# Patient Record
Sex: Male | Born: 1950 | Race: White | Hispanic: No | Smoking: Former smoker
Health system: Southern US, Community
[De-identification: ages and names within clinical notes are randomized; demographics above are authoritative.]

## PROBLEM LIST (undated history)

## (undated) DIAGNOSIS — E7800 Pure hypercholesterolemia, unspecified: Secondary | ICD-10-CM

## (undated) DIAGNOSIS — J45909 Unspecified asthma, uncomplicated: Secondary | ICD-10-CM

## (undated) DIAGNOSIS — R569 Unspecified convulsions: Secondary | ICD-10-CM

## (undated) DIAGNOSIS — I10 Essential (primary) hypertension: Secondary | ICD-10-CM

## (undated) DIAGNOSIS — C679 Malignant neoplasm of bladder, unspecified: Secondary | ICD-10-CM

## (undated) DIAGNOSIS — E785 Hyperlipidemia, unspecified: Secondary | ICD-10-CM

## (undated) DIAGNOSIS — M1711 Unilateral primary osteoarthritis, right knee: Secondary | ICD-10-CM

## (undated) HISTORY — DX: Malignant neoplasm of bladder, unspecified: C67.9

## (undated) HISTORY — DX: Hyperlipidemia, unspecified: E78.5

## (undated) HISTORY — PX: FRACTURE SURGERY: SHX138

## (undated) HISTORY — PX: COLONOSCOPY: SHX174

## (undated) HISTORY — DX: Unspecified asthma, uncomplicated: J45.909

## (undated) HISTORY — PX: OTHER SURGICAL HISTORY: SHX169

## (undated) HISTORY — DX: Unspecified convulsions: R56.9

---

## 2005-02-08 HISTORY — PX: BLADDER SURGERY: SHX569

## 2005-09-28 ENCOUNTER — Emergency Department (HOSPITAL_COMMUNITY): Admission: EM | Admit: 2005-09-28 | Discharge: 2005-09-28 | Payer: Self-pay | Admitting: Emergency Medicine

## 2005-10-16 ENCOUNTER — Encounter (INDEPENDENT_AMBULATORY_CARE_PROVIDER_SITE_OTHER): Payer: Self-pay | Admitting: *Deleted

## 2005-10-16 ENCOUNTER — Ambulatory Visit (HOSPITAL_COMMUNITY): Admission: RE | Admit: 2005-10-16 | Discharge: 2005-10-17 | Payer: Self-pay | Admitting: Urology

## 2009-07-03 ENCOUNTER — Emergency Department (HOSPITAL_COMMUNITY): Admission: EM | Admit: 2009-07-03 | Discharge: 2009-07-04 | Payer: Self-pay | Admitting: Emergency Medicine

## 2009-07-06 ENCOUNTER — Emergency Department (HOSPITAL_COMMUNITY): Admission: EM | Admit: 2009-07-06 | Discharge: 2009-07-06 | Payer: Self-pay | Admitting: Emergency Medicine

## 2009-07-08 HISTORY — PX: ROTATOR CUFF REPAIR: SHX139

## 2009-07-25 ENCOUNTER — Emergency Department (HOSPITAL_COMMUNITY): Admission: EM | Admit: 2009-07-25 | Discharge: 2009-07-25 | Payer: Self-pay | Admitting: Emergency Medicine

## 2009-08-02 ENCOUNTER — Encounter (HOSPITAL_COMMUNITY): Admission: RE | Admit: 2009-08-02 | Discharge: 2009-09-29 | Payer: Self-pay | Admitting: Cardiology

## 2009-08-05 ENCOUNTER — Ambulatory Visit (HOSPITAL_BASED_OUTPATIENT_CLINIC_OR_DEPARTMENT_OTHER): Admission: RE | Admit: 2009-08-05 | Discharge: 2009-08-05 | Payer: Self-pay | Admitting: Orthopedic Surgery

## 2010-03-25 LAB — POCT I-STAT, CHEM 8
Chloride: 107 mEq/L (ref 96–112)
HCT: 45 % (ref 39.0–52.0)
HCT: 48 % (ref 39.0–52.0)
Hemoglobin: 15.3 g/dL (ref 13.0–17.0)
Hemoglobin: 16.3 g/dL (ref 13.0–17.0)
Potassium: 3.8 mEq/L (ref 3.5–5.1)
Sodium: 140 mEq/L (ref 135–145)
TCO2: 27 mmol/L (ref 0–100)

## 2010-03-25 LAB — LIPID PANEL
Total CHOL/HDL Ratio: 4.4 RATIO
VLDL: 16 mg/dL (ref 0–40)

## 2010-03-25 LAB — POCT CARDIAC MARKERS: Myoglobin, poc: 70.5 ng/mL (ref 12–200)

## 2010-03-26 LAB — URINALYSIS, ROUTINE W REFLEX MICROSCOPIC
Bilirubin Urine: NEGATIVE
Glucose, UA: NEGATIVE mg/dL
Ketones, ur: NEGATIVE mg/dL
Leukocytes, UA: NEGATIVE
Nitrite: NEGATIVE
Protein, ur: NEGATIVE mg/dL
Specific Gravity, Urine: 1.017 (ref 1.005–1.030)
Urobilinogen, UA: 0.2 mg/dL (ref 0.0–1.0)
pH: 5.5 (ref 5.0–8.0)

## 2010-03-26 LAB — CBC
HCT: 43.5 % (ref 39.0–52.0)
Hemoglobin: 14.7 g/dL (ref 13.0–17.0)
MCH: 31.6 pg (ref 26.0–34.0)
MCHC: 33.9 g/dL (ref 30.0–36.0)
MCV: 93.4 fL (ref 78.0–100.0)
Platelets: 236 K/uL (ref 150–400)
RBC: 4.66 MIL/uL (ref 4.22–5.81)
RDW: 13.3 % (ref 11.5–15.5)
WBC: 9.4 K/uL (ref 4.0–10.5)

## 2010-03-26 LAB — RAPID URINE DRUG SCREEN, HOSP PERFORMED
Cocaine: NOT DETECTED
Tetrahydrocannabinol: NOT DETECTED

## 2010-03-26 LAB — DIFFERENTIAL
Basophils Absolute: 0 K/uL (ref 0.0–0.1)
Basophils Relative: 1 % (ref 0–1)
Eosinophils Absolute: 0.1 K/uL (ref 0.0–0.7)
Eosinophils Relative: 2 % (ref 0–5)
Lymphocytes Relative: 14 % (ref 12–46)
Lymphs Abs: 1.3 K/uL (ref 0.7–4.0)
Monocytes Absolute: 0.8 K/uL (ref 0.1–1.0)
Monocytes Relative: 9 % (ref 3–12)
Neutro Abs: 7.1 K/uL (ref 1.7–7.7)
Neutrophils Relative %: 75 % (ref 43–77)

## 2010-03-26 LAB — COMPREHENSIVE METABOLIC PANEL WITH GFR
ALT: 24 U/L (ref 0–53)
AST: 28 U/L (ref 0–37)
Albumin: 3.4 g/dL — ABNORMAL LOW (ref 3.5–5.2)
Alkaline Phosphatase: 54 U/L (ref 39–117)
BUN: 17 mg/dL (ref 6–23)
CO2: 28 meq/L (ref 19–32)
Calcium: 9.6 mg/dL (ref 8.4–10.5)
Chloride: 105 meq/L (ref 96–112)
Creatinine, Ser: 1.09 mg/dL (ref 0.4–1.5)
GFR calc non Af Amer: >60 mL/min
Glucose, Bld: 112 mg/dL — ABNORMAL HIGH (ref 70–99)
Potassium: 4 meq/L (ref 3.5–5.1)
Sodium: 140 meq/L (ref 135–145)
Total Bilirubin: 0.5 mg/dL (ref 0.3–1.2)
Total Protein: 6.9 g/dL (ref 6.0–8.3)

## 2010-03-26 LAB — POCT I-STAT, CHEM 8
BUN: 18 mg/dL (ref 6–23)
Calcium, Ion: 1.21 mmol/L (ref 1.12–1.32)
Chloride: 104 meq/L (ref 96–112)
Creatinine, Ser: 1.1 mg/dL (ref 0.4–1.5)
Glucose, Bld: 107 mg/dL — ABNORMAL HIGH (ref 70–99)
HCT: 45 % (ref 39.0–52.0)
Hemoglobin: 15.3 g/dL (ref 13.0–17.0)
Potassium: 3.9 meq/L (ref 3.5–5.1)
Sodium: 139 meq/L (ref 135–145)
TCO2: 27 mmol/L (ref 0–100)

## 2010-03-26 LAB — PROTIME-INR
INR: 0.98 (ref 0.00–1.49)
Prothrombin Time: 12.9 s (ref 11.6–15.2)

## 2010-03-26 LAB — URINE CULTURE
Colony Count: NO GROWTH
Culture: NO GROWTH

## 2010-05-26 NOTE — Op Note (Signed)
NAME:  Todd Howell, Todd Howell NO.:  [id number]   MEDICAL RECORD NO.:  [id number]          PATIENT TYPE:  OIB   LOCATION:  1430                         FACILITY:  [facility]   PHYSICIAN:  Boston Service, M.D.DATE OF BIRTH:  26-Aug-1950   DATE OF PROCEDURE:  10/16/2005  DATE OF DISCHARGE:  10/17/2005                                 OPERATIVE REPORT   PREOPERATIVE DIAGNOSIS:  A 60 year old male seen in our office October 05, 2005 total gross painless hematuria.  Investigation included CT scan which  showed no hydronephrosis, renal mass or upper tract abnormalities,  suggestion of soft tissue density along the right wall of the bladder but no  evidence of extravesical disease.  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   POSTOPERATIVE DIAGNOSIS:  A 60 year old male seen in our office October 05, 2005 total gross painless hematuria.  Investigation included CT scan  which showed no hydronephrosis, renal mass or upper tract abnormalities,  suggestion of soft tissue density along the right wall of the bladder but no  evidence of extravesical disease.  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   PROCEDURE:  Cystoscopy, retrograde, transurethral resection of bladder  tumor, double-J stent placement, bladder biopsies and resection, superficial  specimens sent x 1, deep specimens sent x2, TUR VaporTrode.   SURGEON:  Boston Service, M.D.   ASSISTANT:  None.   ANESTHESIA:  General.   SPECIMENS:  Bladder tumor superficial x 1, deep x2.   DISPOSITION:  To pathology.   SPECIMENS:  To  pathology.   ESTIMATED BLOOD LOSS:  Minimal.   COMPLICATIONS:  None obvious.   DESCRIPTION OF PROCEDURE:  The patient was prepped and draped in the  dorsolithotomy position after institution of adequate level of general  anesthesia.  A well lubricated 21-French panendoscope was gently inserted at  the urethral meatus, normal urethra and sphincter, nonobstructive prostate.  Tumor easily visualized just beyond the junction of the prostate base and  bladder with careful inspection.  The right orifice was visible along the  medial edge of the tumor.  Left orifice was easily visualized.  Careful  inspection of the bladder with the 12 degrees, 30 degrees and 70 degrees  Storz lenses was accomplished.   Retrograde films were then performed, blocking catheter was inserted, first  at the left ureteral orifice then at the right ureteral orifice.  Films were  obtained which showed normal course and caliber of the ureter, pelvis and  calyces without evidence of filling defect or obstruction.  There was the  expected amount of extrinsic compression with crossing of the femoral  vessels but no filling defect or obstruction.  Prompt drainage at 3-5  minutes.  Endoscopic photographs were then taken.  Cystoscope was removed.  Resectoscope sheath was inserted.  Resection was begun along the right  lateral wall at about the 9 o'clock position, carried down to the 6 o'clock  position taking care to avoid bladder perforation or obturator reflex.  Once the bulk of papillary lesion had been resected down to the level of the  bladder wall, specimens were irrigated from the bladder and sent as  superficial tumor.  Cold cup biopsy forceps were then used to biopsy the  bed of the tumor in at least five or six locations.  Cold cup forceps were  then withdrawn and replaced with the TUR loop which was used to resect the  base of the tumor taking care to include underlying muscle.  Once deep  resection had been  accomplished, VaporTrode element was used to obtain  adequate hemostasis.  Prior to the initiation of resection at the time of  the retrograde films, the double-J stent had been placed on the right side  to help in localization of the right orifice and to avoid any injury to the  right distal ureter.  It was great help particularly in the medial aspect of  the resection.  Once all resectable tumor and underlying muscular tissue had  been resected, cold cup forceps were inserted.  Double-J stent was removed.  VaporTrode element was reinserted.  Small bleeding sites were lightly  fulgurated.  There was clear efflux from the right ureteral orifice.  Resectoscope sheath was withdrawn 22-French Foley was inserted.  Bladder was  then patiently irrigated with sterile water over period of about 20 minutes.  Foley was left to straight drain.  The patient was given a B&O suppository  and returned to recovery in satisfactory condition.           ______________________________  Boston Service, M.D.     RH/MEDQ  D:  10/16/2005  T:  10/17/2005  Job:  [id number]   cc:   Ladell Heads., MD  [facility]. [place]

## 2010-11-28 ENCOUNTER — Other Ambulatory Visit: Payer: Self-pay | Admitting: Cardiology

## 2011-07-02 ENCOUNTER — Encounter (INDEPENDENT_AMBULATORY_CARE_PROVIDER_SITE_OTHER): Payer: Self-pay | Admitting: Surgery

## 2011-07-02 ENCOUNTER — Ambulatory Visit (INDEPENDENT_AMBULATORY_CARE_PROVIDER_SITE_OTHER): Payer: BC Managed Care – PPO | Admitting: Surgery

## 2011-07-02 VITALS — BP 130/90 | HR 70 | Temp 99.2°F | Ht 69.0 in | Wt 184.0 lb

## 2011-07-02 DIAGNOSIS — K602 Anal fissure, unspecified: Secondary | ICD-10-CM

## 2011-07-02 DIAGNOSIS — K601 Chronic anal fissure: Secondary | ICD-10-CM | POA: Insufficient documentation

## 2011-07-02 MED ORDER — AMBULATORY NON FORMULARY MEDICATION: 1.0000 "application " | Freq: Four times a day (QID) | Status: DC

## 2011-07-02 NOTE — Progress Notes (Signed)
Subjective:     Patient ID: Todd Howell, male   DOB: 10/08/1950, 61 y.o.   MRN: [id number]  HPI  Todd Howell  Aug 28, 1950 [id number]  Patient Care Team: Quentin Mulling as PCP - General (Geriatric Medicine) Barrie Folk, MD as Consulting Physician (Gastroenterology)  This patient is a 61 y.o.male who presents today for surgical evaluation at the request of Dr. Madilyn Fireman.   Reason for visit: Persistent anal fissure.  Patient is a pleasant male who has noticed anal pain for a year.  He recalls 20 years ago.  He had what sounded like an abscess that had to be drained.  Then had to be reoperated.  No problem for two decades until this past year.  Had anal pain.  He was sent to gastroenterology.  Dr. Madilyn Fireman diagnosed an anal fissure.  He was started on diltiazem cream.  He went back on it.  It is not consistently closed.  Dr. Madilyn Fireman was concerned of persistent fissure and recommended to see surgery to consider sphincterotomy.  No history of Crohn's or ulcerative colitis. Both parents died from colon cancer, though.  He has a bowel movement 1-2 times a day.  Occasionally loose.  Is using stool softeners.  He is reluctant to have any surgery  Patient Active Problem List  Diagnosis  . Chronic anal fissure    Past Medical History  Diagnosis Date  . Asthma   . Cancer   . Hyperlipidemia   . Seizures     Past Surgical History  Procedure Date  . Rotator cuff repair 07/2009    right  . Bladder surgery 02/2005    History   Social History  . Marital Status: Married    Spouse Name: N/A    Number of Children: N/A  . Years of Education: N/A   Occupational History  . Not on file.   Social History Main Topics  . Smoking status: Former Smoker    Quit date: 07/02/1986  . Smokeless tobacco: Not on file  . Alcohol Use: Yes     6drinks  . Drug Use: No  . Sexually Active:    Other Topics Concern  . Not on file   Social History Narrative  . No narrative on file    Family History    Problem Relation Age of Onset  . Cancer Mother     colon  . Cancer Father     colon/lung    Current Outpatient Prescriptions  Medication Sig Dispense Refill  . CRESTOR 20 MG tablet       . divalproex (DEPAKOTE ER) 500 MG 24 hr tablet       . MICARDIS 40 MG tablet          No Known Allergies  BP 130/90  Pulse 70  Temp 99.2 F (37.3 C) (Temporal)  Ht 5\' 9"  (1.753 m)  Wt 184 lb (83.462 kg)  BMI 27.17 kg/m2  SpO2 97%  No results found.   Review of Systems  Constitutional: Negative for fever, chills and diaphoresis.  HENT: Negative for nosebleeds, sore throat, facial swelling, mouth sores, trouble swallowing and ear discharge.   Eyes: Negative for photophobia, discharge and visual disturbance.  Respiratory: Negative for choking, chest tightness, shortness of breath and stridor.   Cardiovascular: Negative for chest pain and palpitations.  Gastrointestinal: Positive for rectal pain. Negative for nausea, vomiting, abdominal pain, diarrhea, constipation, blood in stool, abdominal distention and anal bleeding.       No personal  nor family history of inflammatory bowel disease, irritable bowel syndrome, allergy such as Celiac Sprue, dietary/dairy problems, colitis, ulcers nor gastritis.   BOTH parents Dx'd with colon cancer in their 70's  No recent sick contacts/gastroenteritis.  No travel outside the country.  No changes in diet.    Genitourinary: Negative for dysuria, urgency, difficulty urinating and testicular pain.  Musculoskeletal: Negative for myalgias, back pain, arthralgias and gait problem.  Skin: Negative for color change, pallor, rash and wound.  Neurological: Negative for dizziness, speech difficulty, weakness, numbness and headaches.  Hematological: Negative for adenopathy. Does not bruise/bleed easily.  Psychiatric/Behavioral: Negative for hallucinations, confusion and agitation.       Objective:   Physical Exam  Constitutional: He is oriented to person,  place, and time. He appears well-developed and well-nourished. No distress.  HENT:  Head: Normocephalic.  Mouth/Throat: Oropharynx is clear and moist. No oropharyngeal exudate.  Eyes: Conjunctivae and EOM are normal. Pupils are equal, round, and reactive to light. No scleral icterus.  Neck: Normal range of motion. Neck supple. No tracheal deviation present.  Cardiovascular: Normal rate, regular rhythm and intact distal pulses.   Pulmonary/Chest: Effort normal and breath sounds normal. No respiratory distress.  Abdominal: Soft. He exhibits no distension. There is no tenderness. Hernia confirmed negative in the right inguinal area and confirmed negative in the left inguinal area.  Genitourinary:       Exam done with assistance of male Medical Assistant in the room.  Perianal skin clean with good hygiene.  No pruritis.  No external hemorrhoids of significance.  Smal R anterior skin tag.  No pilonidal disease.   No abscess/fistula.    Post midline fissure with heaped up sentinel anal skin tag.  Increased sphincter tone.      Musculoskeletal: Normal range of motion. He exhibits no tenderness.  Lymphadenopathy:    He has no cervical adenopathy.       Right: No inguinal adenopathy present.       Left: No inguinal adenopathy present.  Neurological: He is alert and oriented to person, place, and time. No cranial nerve deficit. He exhibits normal muscle tone. Coordination normal.  Skin: Skin is warm and dry. No rash noted. He is not diaphoretic. No erythema. No pallor.  Psychiatric: His behavior is normal. Judgment and thought content normal. His mood appears anxious.       Consolable, pleasant       Assessment:     Chronic anal fissure    Plan:     The anatomy & physiology of the anorectal region was discussed.  The pathophysiology of anal fissure and differential diagnosis was discussed.  Natural history progression  was discussed.   I stressed the importance of a bowel regimen to have  daily soft bowel movements to minimize progression of disease.     The patient's condition is not adequately controlled.  Non-operative treatment has not healed the fissure.  Therefore, I recommended examination under anesthesia for better examination to confirm the diagnosis and treat by lateral internal sphincterotomy to relax the spasm better & allow the fissure to heal.  Technique, benefits, alternatives were discussed.   I noted a good likelihood this will help address the problem.  Risks such as bleeding, pain, incontinence, recurrence, heart attack, death, and other risks were discussed.    Educational handouts further explaining the pathology, treatment options, and bowel regimen were given as well.  The patient expressed understanding & wishes to try the diltiazem cream will more time. This  time he promises is to use it 4 times a day.We reached a compromise where I would schedule surgery about 4 weeks out from now. If the fissure is healed by then, we will cancel surgery. If not, then we will proceed with sphincterotomy. He felt this was a reasonable plan.    I strongly recommend he consider a colonoscopy. He agrees.  He is afraid to get it done now.  I think it is reasonable to wait until the fissure has healed up first. He will think about things

## 2011-07-02 NOTE — Patient Instructions (Signed)
Anal Fissure, Adult An anal fissure is a small tear or crack in the skin around the anus. Bleeding from a fissure usually stops on its own within a few minutes. However, bleeding will often reoccur with each bowel movement until the crack heals.  CAUSES   Passing large, hard stools.   Frequent diarrheal stools.   Constipation.   Inflammatory bowel disease (Crohn's disease or ulcerative colitis).   Infections.   Anal sex.  SYMPTOMS   Small amounts of blood seen on your stools, on toilet paper, or in the toilet after a bowel movement.   Rectal bleeding.   Painful bowel movements.   Itching or irritation around the anus.  DIAGNOSIS Your caregiver will examine the anal area. An anal fissure can usually be seen with careful inspection. A rectal exam may be performed and a short tube (anoscope) may be used to examine the anal canal. TREATMENT   You may be instructed to take fiber supplements. These supplements can soften your stool to help make bowel movements easier.   Sitz baths may be recommended to help heal the tear. Do not use soap in the sitz baths.   A medicated cream or ointment may be prescribed to lessen discomfort.  HOME CARE INSTRUCTIONS   Maintain a diet high in fruits, whole grains, and vegetables. Avoid constipating foods like bananas and dairy products.   Take sitz baths as directed by your caregiver.   Drink enough fluids to keep your urine clear or pale yellow.   Only take over-the-counter or prescription medicines for pain, discomfort, or fever as directed by your caregiver. Do not take aspirin as this may increase bleeding.   Do not use ointments containing numbing medications (anesthetics) or hydrocortisone. They could slow healing.  SEEK MEDICAL CARE IF:   Your fissure is not completely healed within 3 days.   You have further bleeding.   You have a fever.   You have diarrhea mixed with blood.   You have pain.   Your problem is getting worse  rather than better.  MAKE SURE YOU:   Understand these instructions.   Will watch your condition.   Will get help right away if you are not doing well or get worse.  Document Released: 12/25/2004 Document Revised: 12/14/2010 Document Reviewed: 06/11/2010 [employer] Patient Information 2012 [employer]. 

## 2011-07-03 IMAGING — CT CT HEAD W/O CM
4 of 6 series · 17 of 47 positions shown, 19 images · non-contrast
Comparison: None.

CT HEAD

CLINICAL DATA: Head and face abrasions following a fall.  Possible
seizure.  No memory of the fall.

CT HEAD WITHOUT CONTRAST
CT CERVICAL SPINE WITHOUT CONTRAST
TECHNIQUE: Multidetector CT imaging of the head and cervical spine
was performed following the standard protocol without intravenous
contrast.  Multiplanar CT image reconstructions of the cervical
spine were also generated.

[Series 4: head trauma 2.4 h60s · axial · 0.43mm/px · z∈[+1332,+1450]mm · 5 of 72 slices shown, 7 images]
[im 12/72  brain]
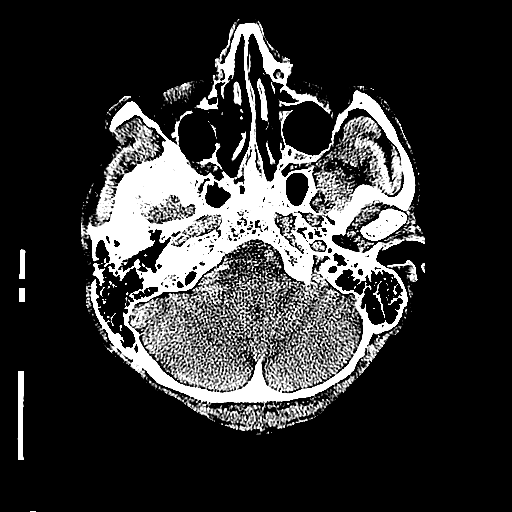
[im 12/72  bone]
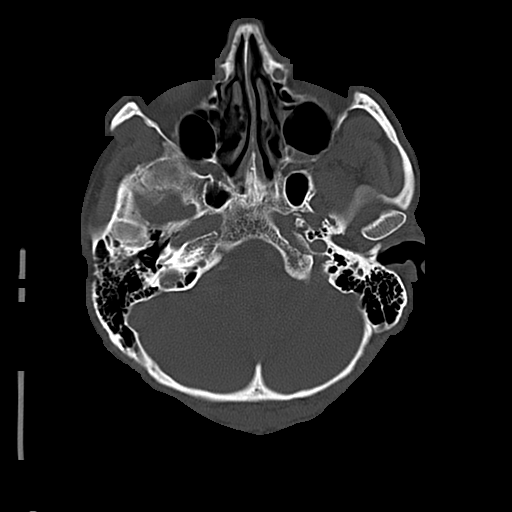
[im 24/72  brain]
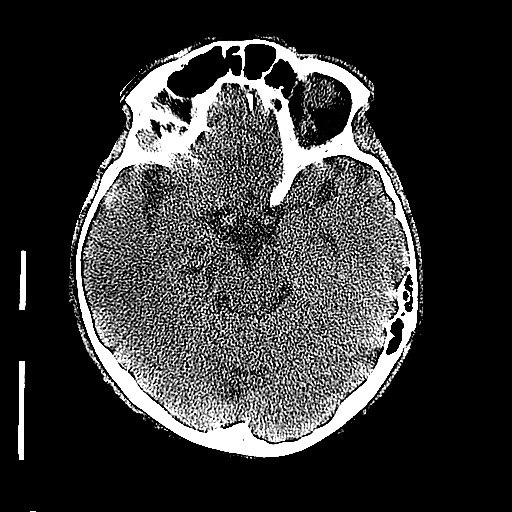
[im 36/72  brain]
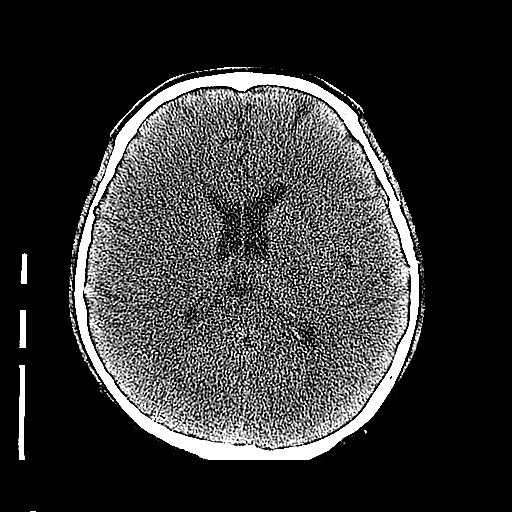
[im 48/72  brain]
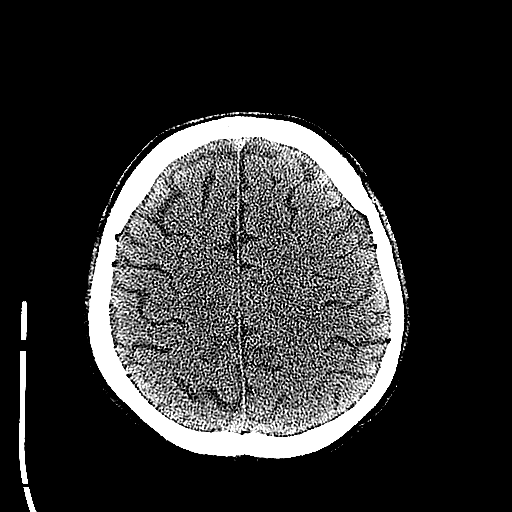
[im 60/72  brain]
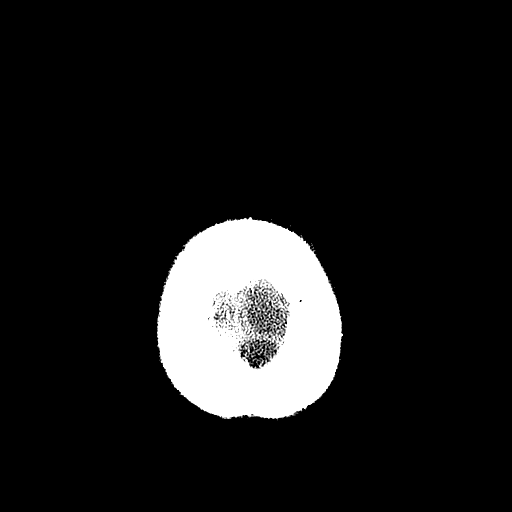
[im 60/72  bone]
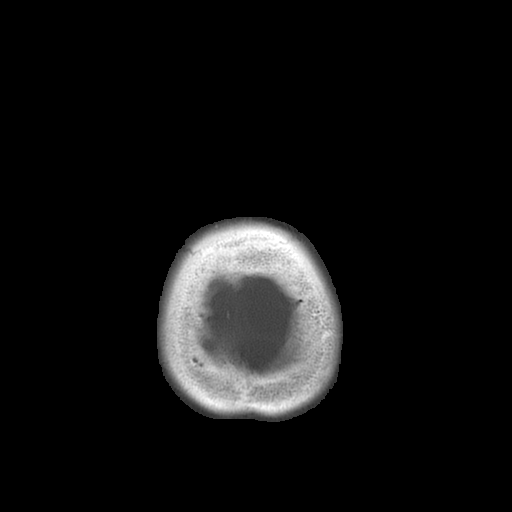

[Series 8: c_spine 2.0 spo thins · coronal · 0.43mm/px · 3 of 58 slices shown (1 of 3)]
[im 20/58  brain]
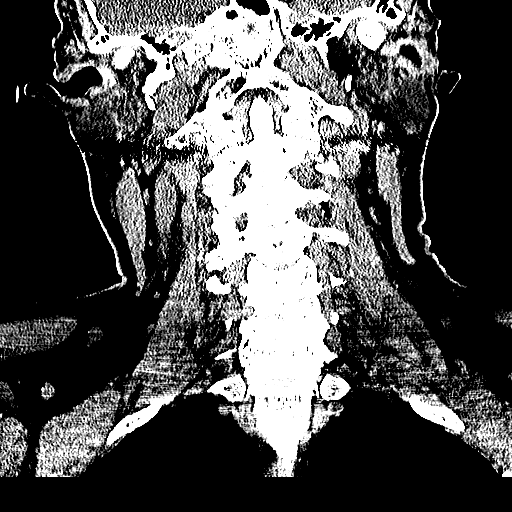
[im 26/58  brain]
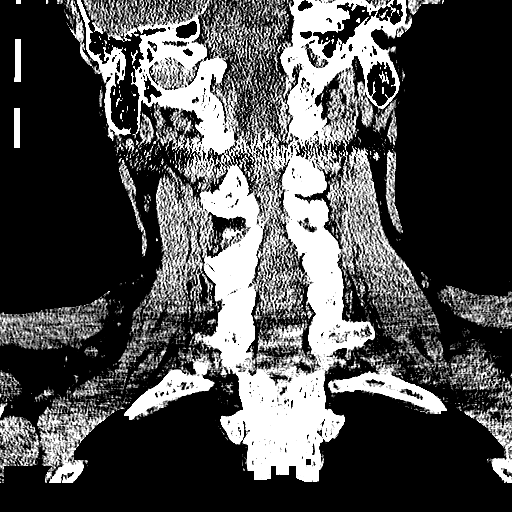
[im 32/58  brain]
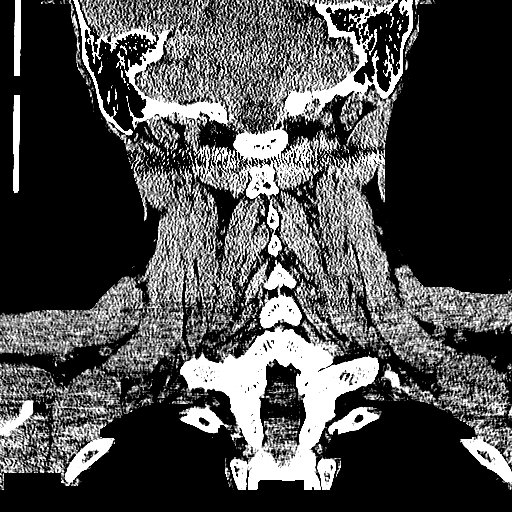

[Series 9: c_spine 2.0 spo thins · sagittal · 0.43mm/px · 3 of 63 slices shown (2 of 3)]
[im 21/63  brain]
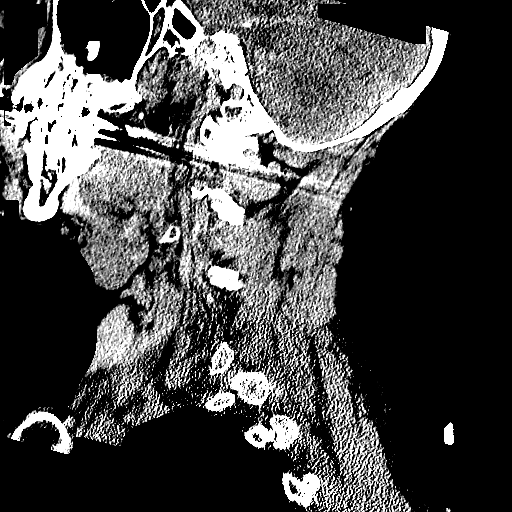
[im 32/63  brain]
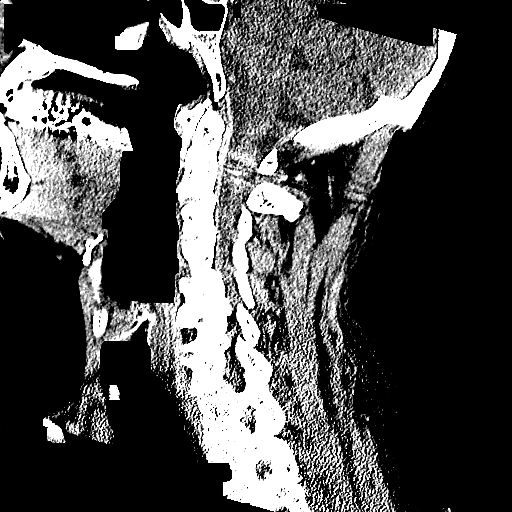
[im 42/63  brain]
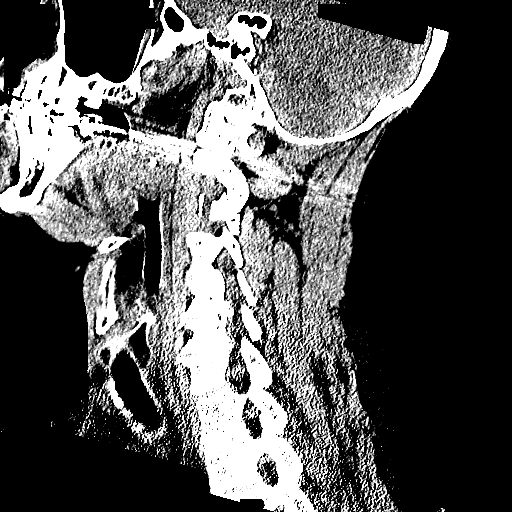

[Series 10: c_spine 2.0 spo thins · axial · 0.43mm/px · z∈[+1117,+1227]mm · 6 of 104 slices shown (3 of 3)]
[im 12/104  brain]
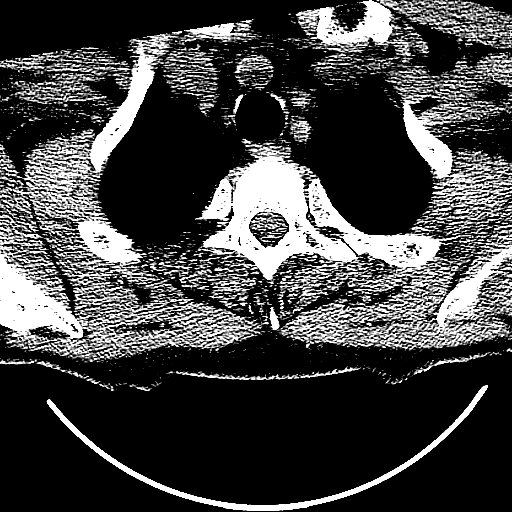
[im 23/104  brain]
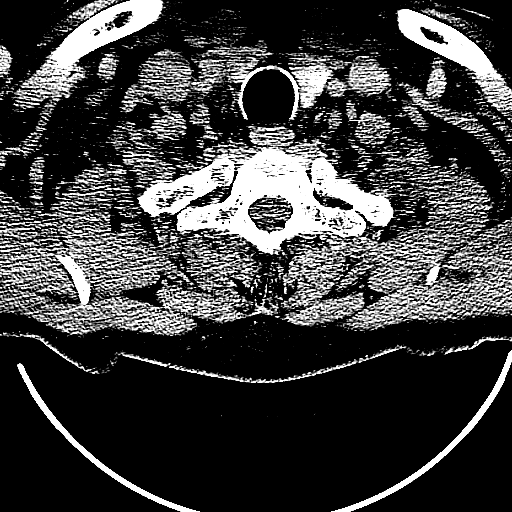
[im 35/104  brain]
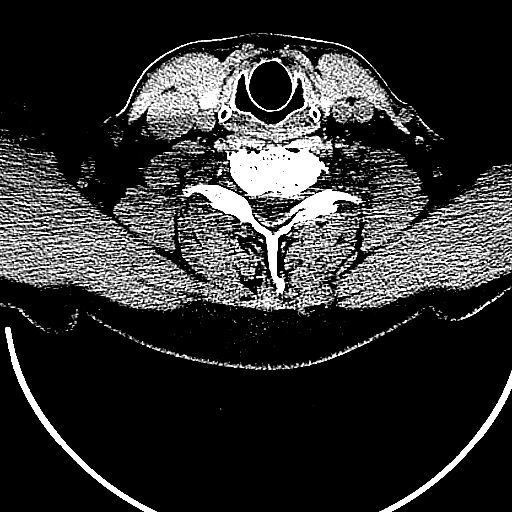
[im 46/104  brain]
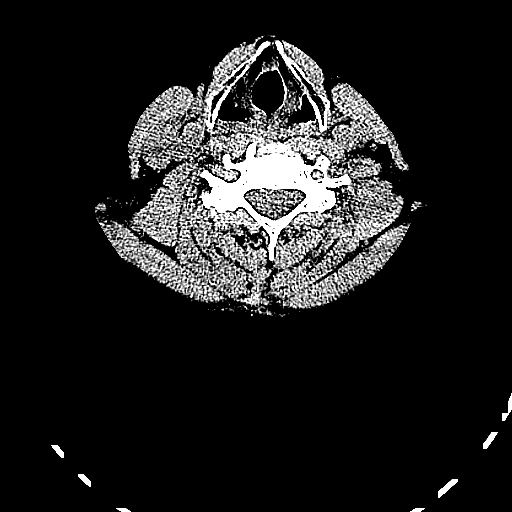
[im 58/104  brain]
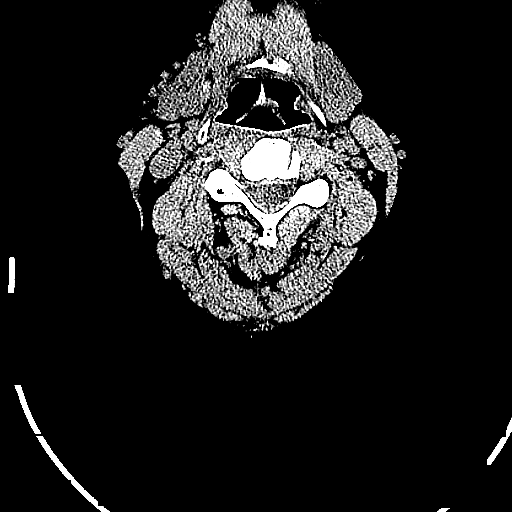
[im 69/104  brain]
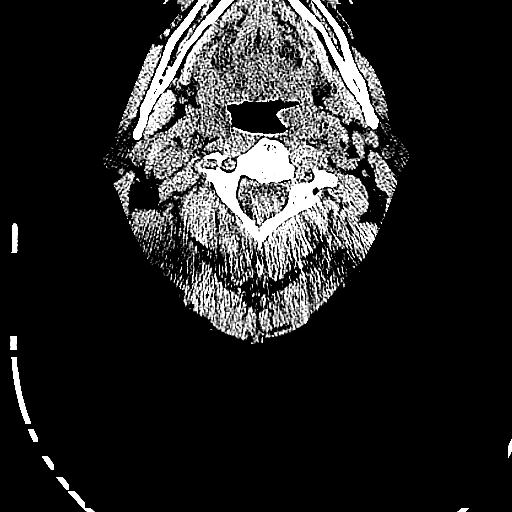

[17 of 47 positions shown; findings below may reference images not displayed]

FINDINGS: Normal appearing cerebral hemispheres and posterior fossa
structures.  Normal size and position of the ventricles.  No skull
fracture, intracranial hemorrhage or paranasal sinus air-fluid
levels.  No mass lesion or CT evidence of acute infarction.  Left
maxillary sinus mucosal thickening with a maximum thickness of
mm.
IMPRESSION: Mild chronic left maxillary sinusitis.  Otherwise, normal
examination.

CT CERVICAL SPINE
FINDINGS: Multilevel degenerative changes.  No prevertebral soft
tissue swelling, fractures or subluxations.  Minimal right carotid
bifurcation calcification.
IMPRESSION: 1.  No fracture or subluxation.
2.  Multilevel degenerative changes.
3.  Minimal right carotid bifurcation atheromatous calcification.

## 2011-09-20 DIAGNOSIS — K648 Other hemorrhoids: Secondary | ICD-10-CM

## 2011-09-20 DIAGNOSIS — K602 Anal fissure, unspecified: Secondary | ICD-10-CM

## 2011-09-20 HISTORY — PX: ANAL FISSURE REPAIR: SHX2312

## 2011-09-20 HISTORY — PX: HEMORRHOID SURGERY: SHX153

## 2011-09-25 ENCOUNTER — Telehealth (INDEPENDENT_AMBULATORY_CARE_PROVIDER_SITE_OTHER): Payer: Self-pay | Admitting: General Surgery

## 2011-09-25 NOTE — Telephone Encounter (Signed)
Pt called to give update following repair anal fissure on 09/20/11.  He has had a bowel movement and is managing pain okay.  He reports feeling like "something is coming out" of the sphincter.  It's "like a grape" between his buttocks.  Recommended warm sitz baths, okay to use cream or gel on hand, stool softeners, baby wipes, etc.  His post op appt is next Tues, 10/02/11, and will check it then.  Pt understands.

## 2011-09-28 ENCOUNTER — Telehealth (INDEPENDENT_AMBULATORY_CARE_PROVIDER_SITE_OTHER): Payer: Self-pay

## 2011-09-28 DIAGNOSIS — Z9889 Other specified postprocedural states: Secondary | ICD-10-CM

## 2011-09-28 MED ORDER — HYDROCODONE-ACETAMINOPHEN 5-325 MG PO TABS: 1.0000 | ORAL_TABLET | Freq: Four times a day (QID) | ORAL | Status: DC | PRN

## 2011-09-28 NOTE — Telephone Encounter (Signed)
Pt requesting refill on Oxycodone. I adv. Pt that I could call him in the automatic refill for hydrocodone 5/325mg . The pt understands. I will call the script to [facility] [address]

## 2011-10-02 ENCOUNTER — Ambulatory Visit (INDEPENDENT_AMBULATORY_CARE_PROVIDER_SITE_OTHER): Payer: BC Managed Care – PPO | Admitting: Surgery

## 2011-10-02 VITALS — BP 118/72 | HR 66 | Temp 97.9°F | Resp 16 | Ht 69.0 in | Wt 182.4 lb

## 2011-10-02 DIAGNOSIS — K602 Anal fissure, unspecified: Secondary | ICD-10-CM

## 2011-10-02 DIAGNOSIS — K648 Other hemorrhoids: Secondary | ICD-10-CM

## 2011-10-02 DIAGNOSIS — K601 Chronic anal fissure: Secondary | ICD-10-CM

## 2011-10-02 MED ORDER — OXYCODONE HCL 5 MG PO TABS: 5.0000 mg | ORAL_TABLET | Freq: Four times a day (QID) | ORAL | Status: DC | PRN

## 2011-10-02 NOTE — Patient Instructions (Addendum)
Managing Pain  Pain after surgery or related to activity is often due to strain/injury to muscle, tendon, nerves and/or incisions.  This pain is usually short-term and will improve in a few months.   Many people find it helpful to do the following things TOGETHER to help speed the process of healing and to get back to regular activity more quickly:  1. Avoid heavy physical activity a.  no lifting greater than 20 pounds b. Do not "push through" the pain.  Listen to your body and avoid positions and maneuvers than reproduce the pain c. Walking is okay as tolerated, but go slowly and stop when getting sore.  d. Remember: If it hurts to do it, then don't do it! 2. Take Anti-inflammatory medication  a. Take with food/snack around the clock for 1-2 weeks i. This helps the muscle and nerve tissues become less irritable and calm down faster b. Choose ONE of the following over-the-counter medications: i. Naproxen 220mg  tabs (ex. Aleve) 1-2 pills twice a day  ii. Ibuprofen 200mg  tabs (ex. Advil, Motrin) 3-4 pills with every meal and just before bedtime iii. Acetaminophen 500mg  tabs (Tylenol) 1-2 pills with every meal and just before bedtime 3. Use a Heating pad or Ice/Cold Pack a. 4-6 times a day b. May use warm bath/hottub  or showers 4. Try Gentle Massage and/or Stretching  a. at the area of pain many times a day b. stop if you feel pain - do not overdo it  Try these steps together to help you body heal faster and avoid making things get worse.  Doing just one of these things may not be enough.    If you are not getting better after two weeks or are noticing you are getting worse, contact our office for further advice; we may need to re-evaluate you & see what other things we can do to help.  HEMORRHOIDS   The rectum is the last few inches of your colon, and it naturally stretches to hold stool.  Hemorrhoidal piles are natural clusters of blood vessels that help the rectum stretch to hold  stool and allow bowel movements to eliminate feces.  Hemorrhoids are abnormally swollen blood vessels in the rectum.  Too much pressure in the rectum causes hemorrhoids by forcing blood to stretch and bulge the walls of the veins, sometimes even rupturing them.  Hemorrhoids can become like varicose veins you might see on a person's legs. When bulging hemorrhoidal veins are irritated, they can swell, burn, itch, become very painful, and bleed. Once the rectal veins have been stretched out and hemorrhoids created, they are difficult to get rid of completely and tend to recur with less straining than it took to cause them in the first place. Fortunately, good habits and simple medical treatment usually control hemorrhoids well, and surgery is only recommended in unusually severe cases. Some of the most frequent causes of hemorrhoids:    Constant sitting    Straining with bowel movements (from constipation or hard stools)    Diarrhea    Sitting on the toilet for a long time    Severe coughing    Childbirth    Heavy Lifting  Types of Hemorrhoids:    Internal hemorrhoids usually don't hurt or itch; they are deep inside the rectum and usually have no sensation. However, internal hemorrhoids can bleed.  Such bleeding should not be ignored and mask blood from a dangerous source like colorectal cancer, so persistent rectal bleeding should be investigated with a  colonoscopy.    External hemorrhoids cause most of the symptoms - pain, burning, and itching. Unirritated hemorrhoids can look like small skin tags coming out of the anus.     Thrombosed hemorrhoids can form when a hemorrhoid blood vessel bursts and causes the hemorrhoid to swell.  A purple blood clot can form in it and become an excruciatingly painful lump at the anus. Because of these unpleasant symptoms, immediate incision and drainage by a surgeon at an office visit can provide much relief of the pain.    PREVENTION Avoiding the causes listed in  above will prevent most cases of hemorrhoids, but this advice is sometimes hard to follow:  How can you avoid sitting all day if you have a seated job? Also, we try to avoid coughing and diarrhea, but sometimes it's beyond your control.  Still, there are some practical hints to help:    If your main job activity is seated, always stand or walk during your breaks. Make it a point to stand and walk at least 5 minutes every hour and try to shift frequently in your chair to avoid direct rectal pressure.    Always exhale as you strain or lift. Don't hold your breath.    Treat coughing, diarrhea and constipation early since irritated hemorrhoids may soon follow.    Do not delay or try to prevent a bowel movement when the urge is present.   Exercise regularly (walking or jogging 60 minutes a day) to stimulate the bowels to move.   Avoid dry toilet paper when cleaning after bowel movements.  Moistened tissues such as baby wipes are less irritating.  Lightly pat the rectal area dry.  Using irrigating showers or bottle irrigation washing can more gently clean this sensitive area.   Keep the anal and genital area clean and  dry.  Talcum or baby powders can help   GET YOUR STOOLS SOFT.   This is the most important way to prevent irritated hemorrhoids.  Hard stools are like sandpaper to the anorectal canal and will cause more problems.   The goal: ONE SOFT BOWEL MOVEMENT A DAY!  To have soft, regular bowel movements:    Drink at least 8 tall glasses of water a day.     AVOID CONSTIPATION    Take plenty of fiber.  Fiber is the undigested part of plant food that passes into the colon, acting s "natures broom" to encourage bowel motility and movement.  Fiber can absorb and hold large amounts of water. This results in a larger, bulkier stool, which is soft and easier to pass. Work gradually over several weeks up to 6 servings a day of fiber (25g a day even more if needed) in the form of: o Vegetables -- Root (potatoes,  carrots, turnips), leafy green (lettuce, salad greens, celery, spinach), or cooked high residue (cabbage, broccoli, etc) o Fruit -- Fresh (unpeeled skin & pulp), Dried (prunes, apricots, cherries, etc ),  or stewed ( applesauce)  o Whole grain breads, pasta, etc (whole wheat)  o Bran cereals    Bulking Agents -- This type of water-retaining fiber generally is easily obtained each day by one of the following:  o Psyllium bran -- The psyllium plant is remarkable because its ground seeds can retain so much water. This product is available as Metamucil, Konsyl, Effersyllium, Per Diem Fiber, or the less expensive generic preparation in drug and health food stores. Although labeled a laxative, it really is not a laxative.  o  Methylcellulose -- This is another fiber derived from wood which also retains water. It is available as Citrucel. o Polyethylene Glycol - and "artificial" fiber commonly called Miralax or Glycolax.  It is helpful for people with gassy or bloated feelings with regular fiber o Flax Seed - a less gassy fiber than psyllium   No reading or other relaxing activity while on the toilet. If bowel movements take longer than 5 minutes, you are too constipated   Laxatives can be useful for a short period if constipation is severe o Osmotics (Milk of Magnesia, Fleets phosphosoda, Magnesium citrate, MiraLax, GoLytely) are safer than  o Stimulants (Senokot, Castor Oil, Dulcolax, Ex Lax)    o Do not take laxatives for more than 7days in a row.   Laxatives are not a good long-term solution as it can stress the intestine and colon and causes too much mineral and fluid losses.    If badly constipated, try a Bowel Retraining Program: o Do not use laxatives.  o Eat a diet high in roughage, such as bran cereals and leafy vegetables.  o Drink six (6) ounces of prune or apricot juice each morning.  o Eat two (2) large servings of stewed fruit each day.  o Take one (1) heaping dose of a bulking agent (ex.  Metamucil, Citrucel, Miralax) twice a day.  o Use sugar-free sweetener when possible to avoid excessive calories.  o Eat a normal breakfast.  o Set aside 15 minutes after breakfast to sit on the toilet, but do not strain to have a bowel movement.  o If you do not have a bowel movement by the third day, use an enema and repeat the above steps.    AVOID DIARRHEA o Switch to liquids and simpler foods for a few days to avoid stressing your intestines further. o Avoid dairy products (especially milk & ice cream) for a short time.  The intestines often can lose the ability to digest lactose when stressed. o Avoid foods that cause gassiness or bloating.  Typical foods include beans and other legumes, cabbage, broccoli, and dairy foods.  Every person has some sensitivity to other foods, so listen to our body and avoid those foods that trigger problems for you. o Adding fiber (Citrucel, Metamucil, psyllium, Miralax) gradually can help thicken stools by absorbing excess fluid and retrain the intestines to act more normally.  Slowly increase the dose over a few weeks.  Too much fiber too soon can backfire and cause cramping & bloating. o Probiotics (such as active yogurt, Align, etc) may help repopulate the intestines and colon with normal bacteria and calm down a sensitive digestive tract.  Most studies show it to be of mild help, though, and such products can be costly. o Medicines:   Bismuth subsalicylate (ex. Kayopectate, Pepto Bismol) every 30 minutes for up to 6 doses can help control diarrhea.  Avoid if pregnant.   Loperamide (Immodium) can slow down diarrhea.  Start with two tablets (4mg  total) first and then try one tablet every 6 hours.  Avoid if you are having fevers or severe pain.  If you are not better or start feeling worse, stop all medicines and call your doctor for advice o Call your doctor if you are getting worse or not better.  Sometimes further testing (cultures, endoscopy, X-ray studies,  bloodwork, etc) may be needed to help diagnose and treat the cause of the diarrhea.   If these preventive measures fail, you must take action right away! Hemorrhoids  are one condition that can be mild in the morning and become intolerable by nightfall.

## 2011-10-02 NOTE — Progress Notes (Signed)
Subjective:     Patient ID: Todd Howell, male   DOB: 01-Jan-1951, 61 y.o.   MRN: [id number]  HPI  Todd Howell  [birth year] [id number]  Patient Care Team: Quentin Mulling as PCP - General (Geriatric Medicine) Barrie Folk, MD as Consulting Physician (Gastroenterology)  This patient is a 61 y.o.male who presents today for surgical evaluation.   It is now two weeks from EUA, sphincterotomy, internal hemorrhoidectomy x2.  Ran out of narcotics.  Trying to use ibuprofen twice a day.  Still rather sore.  Bowels moving better.  Bleeding less.  No fevers or chills.  Feels like the worst to spasm but still rather sore.  He does feel a lump near the anus.  Patient Active Problem List  Diagnosis  . Chronic anal fissure  . Internal hemorrhoids with bleeding    Past Medical History  Diagnosis Date  . Asthma   . Cancer   . Hyperlipidemia   . Seizures     Past Surgical History  Procedure Date  . Rotator cuff repair 07/2009    right  . Bladder surgery 02/2005    History   Social History  . Marital Status: Married    Spouse Name: N/A    Number of Children: N/A  . Years of Education: N/A   Occupational History  . Not on file.   Social History Main Topics  . Smoking status: Former Smoker    Quit date: 07/02/1986  . Smokeless tobacco: Not on file  . Alcohol Use: Yes     6drinks  . Drug Use: No  . Sexually Active:    Other Topics Concern  . Not on file   Social History Narrative  . No narrative on file    Family History  Problem Relation Age of Onset  . Cancer Mother     colon  . Cancer Father     colon/lung    Current Outpatient Prescriptions  Medication Sig Dispense Refill  . CRESTOR 20 MG tablet       . divalproex (DEPAKOTE ER) 500 MG 24 hr tablet       . HYDROcodone-acetaminophen (NORCO) 5-325 MG per tablet Take 1-2 tablets by mouth every 6 (six) hours as needed for pain.  30 tablet  0  . MICARDIS 40 MG tablet       . AMBULATORY NON FORMULARY MEDICATION  Place 1 application rectally 4 (four) times daily. Diltiazem 2% compounded suspension.  1 Tube  2  . oxyCODONE (OXY IR/ROXICODONE) 5 MG immediate release tablet Take 1-2 tablets (5-10 mg total) by mouth every 6 (six) hours as needed for pain.  40 tablet  0     No Known Allergies  BP 118/72  Pulse 66  Temp 97.9 F (36.6 C) (Temporal)  Resp 16  Ht 5\' 9"  (1.753 m)  Wt 182 lb 6.4 oz (82.736 kg)  BMI 26.94 kg/m2  No results found.   Review of Systems  Constitutional: Negative for fever, chills and diaphoresis.  HENT: Negative for sore throat, trouble swallowing and neck pain.   Eyes: Negative for photophobia and visual disturbance.  Respiratory: Negative for choking and shortness of breath.   Cardiovascular: Negative for chest pain and palpitations.  Gastrointestinal: Positive for anal bleeding and rectal pain. Negative for nausea, vomiting, constipation, blood in stool and abdominal distention.  Genitourinary: Negative for dysuria, urgency, difficulty urinating and testicular pain.  Musculoskeletal: Negative for myalgias, arthralgias and gait problem.  Skin: Negative for color change  and rash.  Neurological: Negative for dizziness, speech difficulty, weakness and numbness.  Hematological: Negative for adenopathy.  Psychiatric/Behavioral: Negative for hallucinations, confusion and agitation.       Objective:   Physical Exam  Constitutional: He is oriented to person, place, and time. He appears well-developed and well-nourished. No distress.  HENT:  Head: Normocephalic.  Mouth/Throat: Oropharynx is clear and moist. No oropharyngeal exudate.  Eyes: Conjunctivae normal and EOM are normal. Pupils are equal, round, and reactive to light. No scleral icterus.  Neck: Normal range of motion. No tracheal deviation present.  Cardiovascular: Normal rate, normal heart sounds and intact distal pulses.   Pulmonary/Chest: Effort normal. No respiratory distress.  Abdominal: Soft. He exhibits no  distension. There is no tenderness. Hernia confirmed negative in the right inguinal area and confirmed negative in the left inguinal area.       Incisions clean with normal healing ridges.  No hernias  Genitourinary:       Perianal skin clean with good hygiene.  No pruritis.   No pilonidal disease.  No fissure.  No abscess/fistula. Normal sphincter tone.    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  No bleeding    Musculoskeletal: Normal range of motion. He exhibits no tenderness.  Neurological: He is alert and oriented to person, place, and time. No cranial nerve deficit. He exhibits normal muscle tone. Coordination normal.  Skin: Skin is warm and dry. No rash noted. He is not diaphoretic.  Psychiatric: He has a normal mood and affect. His behavior is normal.       Assessment:     Sore but stabilizing    Plan:     Increase activity as tolerated.  Do not push through pain.  Make ibuprofen 4 times a day.  Restart narcotics.  I am hopeful that the swelling around the wound will flatten down and calm down.  Followup.  Avoid reoperating.  Advanced on diet as tolerated. Bowel regimen to avoid problems.  Switch off Metamucil Is still struggling with diarrhea.  Return to clinic 2-3 weeks. The patient expressed understanding and appreciation

## 2011-10-19 ENCOUNTER — Telehealth (INDEPENDENT_AMBULATORY_CARE_PROVIDER_SITE_OTHER): Payer: Self-pay

## 2011-10-19 NOTE — Telephone Encounter (Signed)
I paged Dr Michaell Cowing and got the ok to give him Oxycodone 5mg  #40.  I will have another md write it.  He also wants to make sure the patient is doing warm soaks, using wet wipes and taking one of the NSAIDS throughout the day.  I called the pt and let him know we will have the rx ready for him to pick up.  I asked him about the soaks and he is not doing them as much as he should.  He has used some Ibuprofen and is using the wet wipes.    The rx is for Oxycodone 5 mg po 1-2 q 6hrs prn pain #40 with no refills

## 2011-10-19 NOTE — Telephone Encounter (Signed)
The patient called requesting more Oxycodone 5mg .  He is very uncomfortable and especially after bowel movements.  He is having some bleeding and drainage.  He has some tissue that is popped out.  He has no fever.  He comes in next Wednesday and wants something to hold him over until then.

## 2011-10-24 ENCOUNTER — Ambulatory Visit (INDEPENDENT_AMBULATORY_CARE_PROVIDER_SITE_OTHER): Payer: BC Managed Care – PPO | Admitting: Surgery

## 2011-10-24 ENCOUNTER — Encounter (INDEPENDENT_AMBULATORY_CARE_PROVIDER_SITE_OTHER): Payer: Self-pay | Admitting: Surgery

## 2011-10-24 VITALS — BP 116/70 | HR 72 | Temp 97.8°F | Resp 16 | Ht 69.0 in | Wt 187.4 lb

## 2011-10-24 DIAGNOSIS — K602 Anal fissure, unspecified: Secondary | ICD-10-CM

## 2011-10-24 DIAGNOSIS — K648 Other hemorrhoids: Secondary | ICD-10-CM

## 2011-10-24 DIAGNOSIS — K601 Chronic anal fissure: Secondary | ICD-10-CM

## 2011-10-24 NOTE — Patient Instructions (Signed)
ANORECTAL SURGERY: POST OP INSTRUCTIONS  1. Take your usually prescribed home medications unless otherwise directed. 2. DIET: Follow a light bland diet the first 24 hours after arrival home, such as soup, liquids, crackers, etc.  Be sure to include lots of fluids daily.  Avoid fast food or heavy meals as your are more likely to get nauseated.  Eat a low fat the next few days after surgery.   3. PAIN CONTROL: a. Pain is best controlled by a usual combination of three different methods TOGETHER: i. Ice/Heat ii. Over the counter pain medication iii. Prescription pain medication b. Most patients will experience some swelling and discomfort in the anus/rectal area. and incisions.  Ice packs or heat (30-60 minutes up to 6 times a day) will help. Use ice for the first few days to help decrease swelling and bruising, then switch to heat such as warm towels, sitz baths, warm baths, etc to help relax tight/sore spots and speed recovery.  Some people prefer to use ice alone, heat alone, alternating between ice & heat.  Experiment to what works for you.  Swelling and bruising can take several weeks to resolve.   c. It is helpful to take an over-the-counter pain medication regularly for the first few weeks.  Choose one of the following that works best for you: i. Naproxen (Aleve, etc)  Two 220mg tabs twice a day ii. Ibuprofen (Advil, etc) Three 200mg tabs four times a day (every meal & bedtime) iii. Acetaminophen (Tylenol, etc) 500-650mg four times a day (every meal & bedtime) d. A  prescription for pain medication (such as oxycodone, hydrocodone, etc) should be given to you upon discharge.  Take your pain medication as prescribed.  i. If you are having problems/concerns with the prescription medicine (does not control pain, nausea, vomiting, rash, itching, etc), please call us [phone] to see if we need to switch you to a different pain medicine that will work better for you and/or control your side effect  better. ii. If you need a refill on your pain medication, please contact your pharmacy.  They will contact our office to request authorization. Prescriptions will not be filled after 5 pm or on week-ends. 4. KEEP YOUR BOWELS REGULAR a. The goal is one bowel movement a day b. Avoid getting constipated.  Between the surgery and the pain medications, it is common to experience some constipation.  Increasing fluid intake and taking a fiber supplement (such as Metamucil, Citrucel, FiberCon, MiraLax, etc) 1-2 times a day regularly will usually help prevent this problem from occurring.  A mild laxative (prune juice, Milk of Magnesia, MiraLax, etc) should be taken according to package directions if there are no bowel movements after 48 hours. c. Watch out for diarrhea.  If you have many loose bowel movements, simplify your diet to bland foods & liquids for a few days.  Stop any stool softeners and decrease your fiber supplement.  Switching to mild anti-diarrheal medications (Kayopectate, Pepto Bismol) can help.  If this worsens or does not improve, please call us.  5. Wound Care a. Remove your bandages the day after surgery.  Unless discharge instructions indicate otherwise, leave your bandage dry and in place overnight.  Remove the bandage during your first bowel movement.   b. Allow the wound packing to fall out over the next few days.  You can trim exposed gauze / ribbon as it falls out.  You do not need to repack the wound unless instructed otherwise.  Wear an   absorbent pad or soft cotton gauze in your underwear as needed to catch any drainage and help keep the area  c. Keep the area clean and dry.  Bathe / shower every day.  Keep the area clean by showering / bathing over the incision / wound.   It is okay to soak an open wound to help wash it.  Wet wipes or showers / gentle washing after bowel movements is often less traumatic than regular toilet paper. d. You may have some styrofoam-like soft packing in  the rectum which will come out with the first bowel movement.  e. You will often notice bleeding with bowel movements.  This should slow down by the end of the first week of surgery f. Expect some drainage.  This should slow down, too, by the end of the first week of surgery.  Wear an absorbent pad or soft cotton gauze in your underwear until the drainage stops. 6. ACTIVITIES as tolerated:   a. You may resume regular (light) daily activities beginning the next day--such as daily self-care, walking, climbing stairs--gradually increasing activities as tolerated.  If you can walk 30 minutes without difficulty, it is safe to try more intense activity such as jogging, treadmill, bicycling, low-impact aerobics, swimming, etc. b. Save the most intensive and strenuous activity for last such as sit-ups, heavy lifting, contact sports, etc  Refrain from any heavy lifting or straining until you are off narcotics for pain control.   c. DO NOT PUSH THROUGH PAIN.  Let pain be your guide: If it hurts to do something, don't do it.  Pain is your body warning you to avoid that activity for another week until the pain goes down. d. You may drive when you are no longer taking prescription pain medication, you can comfortably sit for long periods of time, and you can safely maneuver your car and apply brakes. e. You may have sexual intercourse when it is comfortable.  7. FOLLOW UP in our office a. Please call [facility] at [phone] to set up an appointment to see your surgeon in the office for a follow-up appointment approximately 2 weeks after your surgery. b. Make sure that you call for this appointment the day you arrive home to insure a convenient appointment time. 10. IF YOU HAVE DISABILITY OR FAMILY LEAVE FORMS, BRING THEM TO THE OFFICE FOR PROCESSING.  DO NOT GIVE THEM TO YOUR DOCTOR.        WHEN TO CALL US [phone]: 1. Poor pain control 2. Reactions / problems with new medications (rash/itching, nausea,  etc)  3. Fever over 101.5 F (38.5 C) 4. Inability to urinate 5. Nausea and/or vomiting 6. Worsening swelling or bruising 7. Continued bleeding from incision. 8. Increased pain, redness, or drainage from the incision  The clinic staff is available to answer your questions during regular business hours (8:30am-5pm).  Please don't hesitate to call and ask to speak to one of our nurses for clinical concerns.   A surgeon from [facility] is always on call at the hospitals   If you have a medical emergency, go to the nearest emergency room or call 911.    [facility] [address], [address], [place], [place]  [address] ? MAIN: [phone] ? TOLL FREE: [phone] ? FAX [phone] [email or web address]    

## 2011-10-24 NOTE — Progress Notes (Addendum)
Subjective:     Patient ID: Todd Howell, male   DOB: [birth year], 61 y.o.   MRN: [id number]  HPI   MICHAELANGELO MITTELMAN  07-15-1950 [id number]  Patient Care Team: Quentin Mulling as PCP - General (Geriatric Medicine) Barrie Folk, MD as Consulting Physician (Gastroenterology)  This patient is a 61 y.o.male who presents today for surgical evaluation.   It is now 1 month from EUA, sphincterotomy, internal hemorrhoidectomy x2.  Finished narcotics.  Using ibuprofen occasionally.  Much less sore.  Bowels moving better.  Bleeding less.  No fevers or chills.  Feels like the worst to spasm but still rather sore.  He still does feel a lump near the anus & wonders if that will go away  Patient Active Problem List  Diagnosis  . Chronic anal fissure  . Internal hemorrhoids with bleeding    Past Medical History  Diagnosis Date  . Asthma   . Cancer   . Hyperlipidemia   . Seizures     Past Surgical History  Procedure Date  . Rotator cuff repair 07/2009    right  . Bladder surgery 02/2005  . Anal fissure repair 09/20/2011    Lat sphincterotomy  . Hemorrhoid surgery 09/20/2011    Int hemorrhoidectomy x 2    History   Social History  . Marital Status: Married    Spouse Name: N/A    Number of Children: N/A  . Years of Education: N/A   Occupational History  . Not on file.   Social History Main Topics  . Smoking status: Former Smoker    Quit date: 07/02/1986  . Smokeless tobacco: Not on file  . Alcohol Use: Yes     6drinks  . Drug Use: No  . Sexually Active:    Other Topics Concern  . Not on file   Social History Narrative  . No narrative on file    Family History  Problem Relation Age of Onset  . Cancer Mother     colon  . Cancer Father     colon/lung    Current Outpatient Prescriptions  Medication Sig Dispense Refill  . AMBULATORY NON FORMULARY MEDICATION Place 1 application rectally 4 (four) times daily. Diltiazem 2% compounded suspension.  1 Tube  2  . CRESTOR  20 MG tablet       . divalproex (DEPAKOTE ER) 500 MG 24 hr tablet       . HYDROcodone-acetaminophen (NORCO) 5-325 MG per tablet Take 1-2 tablets by mouth every 6 (six) hours as needed for pain.  30 tablet  0  . MICARDIS 40 MG tablet       . oxyCODONE (OXY IR/ROXICODONE) 5 MG immediate release tablet Take 1-2 tablets (5-10 mg total) by mouth every 6 (six) hours as needed for pain.  40 tablet  0     No Known Allergies  BP 116/70  Pulse 72  Temp 97.8 F (36.6 C) (Temporal)  Resp 16  Ht 5\' 9"  (1.753 m)  Wt 187 lb 6.4 oz (85.004 kg)  BMI 27.67 kg/m2  No results found.   Review of Systems  Constitutional: Negative for fever, chills and diaphoresis.  HENT: Negative for sore throat, trouble swallowing and neck pain.   Eyes: Negative for photophobia and visual disturbance.  Respiratory: Negative for choking and shortness of breath.   Cardiovascular: Negative for chest pain and palpitations.  Gastrointestinal: Positive for anal bleeding and rectal pain. Negative for nausea, vomiting, constipation, blood in stool and abdominal distention.  Genitourinary: Negative for dysuria, urgency, difficulty urinating and testicular pain.  Musculoskeletal: Negative for myalgias, arthralgias and gait problem.  Skin: Negative for color change and rash.  Neurological: Negative for dizziness, speech difficulty, weakness and numbness.  Hematological: Negative for adenopathy.  Psychiatric/Behavioral: Negative for hallucinations, confusion and agitation.       Objective:   Physical Exam  Constitutional: He is oriented to person, place, and time. He appears well-developed and well-nourished. No distress.  HENT:  Head: Normocephalic.  Mouth/Throat: Oropharynx is clear and moist. No oropharyngeal exudate.  Eyes: Conjunctivae normal and EOM are normal. Pupils are equal, round, and reactive to light. No scleral icterus.  Neck: Normal range of motion. No tracheal deviation present.  Cardiovascular: Normal  rate, normal heart sounds and intact distal pulses.   Pulmonary/Chest: Effort normal. No respiratory distress.  Abdominal: Soft. He exhibits no distension. There is no tenderness. Hernia confirmed negative in the right inguinal area and confirmed negative in the left inguinal area.       Incisions clean with normal healing ridges.  No hernias  Genitourinary:       Perianal skin clean with good hygiene.  No pruritis.   No pilonidal disease.  No fissure.  No abscess/fistula. Normal sphincter tone.    Fissure nearly closed.  L post lat ext hem/anal tags with swelling.  No bleeding    Musculoskeletal: Normal range of motion. He exhibits no tenderness.  Neurological: He is alert and oriented to person, place, and time. No cranial nerve deficit. He exhibits normal muscle tone. Coordination normal.  Skin: Skin is warm and dry. No rash noted. He is not diaphoretic.  Psychiatric: He has a normal mood and affect. His behavior is normal.       Assessment:     Sore but stabilizing    Plan:     Increase activity as tolerated.  Do not push through pain.  Aleve instead of ibuprofen.  Hold off on restart narcotics if OK so far  Advanced on diet as tolerated. Bowel regimen to avoid problems.  Switch off Metamucil Is still struggling with diarrhea.  I am hopeful that the swelling around the wound will continue to flatten down and calm down.  Followup.  Avoid reoperating.  Return to clinic 4 weeks. The patient expressed understanding and appreciation

## 2011-11-09 ENCOUNTER — Telehealth (INDEPENDENT_AMBULATORY_CARE_PROVIDER_SITE_OTHER): Payer: Self-pay

## 2011-11-09 DIAGNOSIS — G8918 Other acute postprocedural pain: Secondary | ICD-10-CM

## 2011-11-09 MED ORDER — HYDROCODONE-ACETAMINOPHEN 5-325 MG PO TABS: 1.0000 | ORAL_TABLET | Freq: Four times a day (QID) | ORAL | Status: DC | PRN

## 2011-11-09 NOTE — Telephone Encounter (Signed)
Return call to patient--  Patient reports an increase in rectal discomfort.  Patient states he feel's an increase in pressure and discomfort, then it drains and the pain will go away until the swelling returns.  Patient continues with sitz baths with no relief.  Patient s/p EUA Sphincterotomy 09/20/11.  Patient last prescription for pain medication was on 10/02/11 for Percocet #40.  Reviewed with Dr. Ezzard Standing, verbal order to call Vicodin 5/325mg  #20, 0 refills.  Patient given appointment for 11/19/11 @ 3:00pm  w/ Dr. Michaell Cowing.  Vicodin called in to [facility] [place] [phone].

## 2011-11-10 NOTE — Telephone Encounter (Signed)
Call in Augmentin x 5 days if purulent drainage or not better by Monday Refill diltiazem cream 2% PR QID.  Offer Analpram as well Aleve 2 pills BID

## 2011-11-12 NOTE — Telephone Encounter (Signed)
Called to check on patient. Wife answered and said he had just left. I made her aware I was calling to check on him. She stated he felt a little better. I let her know to have him call us if he is still having problems and we can call in some other medications for him per Dr Michaell Cowing if needed. She will have him call if he needs to.

## 2011-11-13 MED ORDER — AMOXICILLIN-POT CLAVULANATE 875-125 MG PO TABS: 1.0000 | ORAL_TABLET | Freq: Two times a day (BID) | ORAL | Status: DC

## 2011-11-13 MED ORDER — PRAMOXINE-HC 1-1 % EX CREA: TOPICAL_CREAM | Freq: Three times a day (TID) | CUTANEOUS | Status: DC

## 2011-11-13 NOTE — Telephone Encounter (Signed)
Patient called back. He is doing better. He does state that he feels a bulge at his rectum that drains, he feels better, and then it gets large again. He does state it is purulent drainage. His pain is controlled well but I advised it gets worse to try aleve twice daily. I told him I would sent RX for antibiotics and anal pram to his pharmacy. I advised to try the antibiotics a couple days and anal pram if rectal pain gets worse. He states the surgery site feels fine. He is moving his bowels and having no additional problems.

## 2011-11-13 NOTE — Addendum Note (Signed)
Addended byLiliana Cline on: 11/13/2011 10:46 AM   Modules accepted: Orders

## 2011-11-19 ENCOUNTER — Ambulatory Visit (INDEPENDENT_AMBULATORY_CARE_PROVIDER_SITE_OTHER): Payer: BC Managed Care – PPO | Admitting: Surgery

## 2011-11-19 ENCOUNTER — Encounter (INDEPENDENT_AMBULATORY_CARE_PROVIDER_SITE_OTHER): Payer: Self-pay | Admitting: Surgery

## 2011-11-19 VITALS — BP 104/78 | HR 72 | Temp 98.5°F | Resp 16 | Ht 69.0 in | Wt 188.2 lb

## 2011-11-19 DIAGNOSIS — K602 Anal fissure, unspecified: Secondary | ICD-10-CM

## 2011-11-19 DIAGNOSIS — K648 Other hemorrhoids: Secondary | ICD-10-CM

## 2011-11-19 DIAGNOSIS — K601 Chronic anal fissure: Secondary | ICD-10-CM

## 2011-11-19 DIAGNOSIS — K644 Residual hemorrhoidal skin tags: Secondary | ICD-10-CM

## 2011-11-19 DIAGNOSIS — C679 Malignant neoplasm of bladder, unspecified: Secondary | ICD-10-CM | POA: Insufficient documentation

## 2011-11-19 NOTE — Patient Instructions (Signed)

## 2011-11-19 NOTE — Progress Notes (Signed)
Subjective:     Patient ID: Todd Howell, male   DOB: 10/21/1950, 61 y.o.   MRN: [id number]  HPI   IHSAN NOMURA  October 07, 1950 [id number]  Patient Care Team: Quentin Mulling as PCP - General (Geriatric Medicine) Barrie Folk, MD as Consulting Physician (Gastroenterology)  This patient is a 61 y.o.male who presents today for surgical evaluation.   Procedure: Left lateral internal sphincterotomy and right anterior/posterior internal hemorrhoidectomies 09/20/2011  It is now 2 months out.  He did have an episode of yellow drainage and discomfort.  He held off on filling antibiotics and other medicines.  Drainage has: Down.  He still feels some lump on the left side. .  No severe pain with defecation like with the fissure.  That is much improved.  Just some annoying soreness and discomfort at times.  Slowly getting better.   Bowels moving better on Metamucil at lower dose than last visit.  No bleeding.   No fevers or chills.    He saw his Urologist over concerns of some belly pain.  Was worried he may have recurrence of bladder cancer.  CT scan looks normal.  Patient Active Problem List  Diagnosis  . Internal hemorrhoids with bleeding  . External hemorrhoids with irritation ( L lateral)    Past Medical History  Diagnosis Date  . Asthma   . Cancer   . Hyperlipidemia   . Seizures     Past Surgical History  Procedure Date  . Rotator cuff repair 07/2009    right  . Bladder surgery 02/2005  . Anal fissure repair 09/20/2011    Lat sphincterotomy  . Hemorrhoid surgery 09/20/2011    Int hemorrhoidectomy x 2  . Schinterotomy     History   Social History  . Marital Status: Married    Spouse Name: N/A    Number of Children: N/A  . Years of Education: N/A   Occupational History  . Not on file.   Social History Main Topics  . Smoking status: Former Smoker    Quit date: 07/02/1986  . Smokeless tobacco: Not on file  . Alcohol Use: Yes     Comment: 6drinks  . Drug Use: No  .  Sexually Active:    Other Topics Concern  . Not on file   Social History Narrative  . No narrative on file    Family History  Problem Relation Age of Onset  . Cancer Mother     colon  . Cancer Father     colon/lung    Current Outpatient Prescriptions  Medication Sig Dispense Refill  . AMBULATORY NON FORMULARY MEDICATION Place 1 application rectally 4 (four) times daily. Diltiazem 2% compounded suspension.  1 Tube  2  . CRESTOR 20 MG tablet       . divalproex (DEPAKOTE ER) 500 MG 24 hr tablet       . MICARDIS 40 MG tablet          No Known Allergies  BP 104/78  Pulse 72  Temp 98.5 F (36.9 C) (Temporal)  Resp 16  Ht 5\' 9"  (1.753 m)  Wt 188 lb 3.2 oz (85.367 kg)  BMI 27.79 kg/m2  No results found.   Review of Systems  Constitutional: Negative for fever, chills and diaphoresis.  HENT: Negative for sore throat, trouble swallowing and neck pain.   Eyes: Negative for photophobia and visual disturbance.  Respiratory: Negative for choking and shortness of breath.   Cardiovascular: Negative for chest pain  and palpitations.  Gastrointestinal: Positive for anal bleeding and rectal pain. Negative for nausea, vomiting, constipation, blood in stool and abdominal distention.  Genitourinary: Negative for dysuria, urgency, difficulty urinating and testicular pain.  Musculoskeletal: Negative for myalgias, arthralgias and gait problem.  Skin: Negative for color change and rash.  Neurological: Negative for dizziness, speech difficulty, weakness and numbness.  Hematological: Negative for adenopathy.  Psychiatric/Behavioral: Negative for hallucinations, confusion and agitation.       Objective:   Physical Exam  Constitutional: He is oriented to person, place, and time. He appears well-developed and well-nourished. No distress.  HENT:  Head: Normocephalic.  Mouth/Throat: Oropharynx is clear and moist. No oropharyngeal exudate.  Eyes: Conjunctivae normal and EOM are normal.  Pupils are equal, round, and reactive to light. No scleral icterus.  Neck: Normal range of motion. No tracheal deviation present.  Cardiovascular: Normal rate, normal heart sounds and intact distal pulses.   Pulmonary/Chest: Effort normal. No respiratory distress.  Abdominal: Soft. He exhibits no distension. There is no tenderness. Hernia confirmed negative in the right inguinal area and confirmed negative in the left inguinal area.       Incisions clean with normal healing ridges.  No hernias  Genitourinary:       Perianal skin clean with good hygiene.  No pruritis.   No pilonidal disease.  No fissure.  No abscess/fistula. Normal sphincter tone.    Fissure closed.  L post lat ext hem/anal tags with swelling - a little smaller.  No bleeding    Musculoskeletal: Normal range of motion. He exhibits no tenderness.  Neurological: He is alert and oriented to person, place, and time. No cranial nerve deficit. He exhibits normal muscle tone. Coordination normal.  Skin: Skin is warm and dry. No rash noted. He is not diaphoretic.  Psychiatric: He has a normal mood and affect. His behavior is normal.       Assessment:     Stabilizing    Plan:     Increase activity as tolerated.  Do not push through pain.  Aleve instead of ibuprofen.  Hold off on restart narcotics  Bowel regimen to avoid problems.  Goal is 1 BM/day.  Followup with Dr. Annabell Howells with Urology concerning bladder pelvic issues  I am hopeful that the swelling L at ext hemorrhoids wil continue to flatten down and calm down.  Followup in 4 weeks. If they persist at being annoying more than four months out, consider outpatient external hemorrhoidectomy.  I would like to wait until the swelling is maximally decreased.  He agrees.The patient expressed understanding and appreciation

## 2011-11-26 ENCOUNTER — Encounter (INDEPENDENT_AMBULATORY_CARE_PROVIDER_SITE_OTHER): Payer: BC Managed Care – PPO | Admitting: Surgery

## 2011-12-19 ENCOUNTER — Encounter (INDEPENDENT_AMBULATORY_CARE_PROVIDER_SITE_OTHER): Payer: BC Managed Care – PPO | Admitting: Surgery

## 2012-01-14 ENCOUNTER — Encounter (INDEPENDENT_AMBULATORY_CARE_PROVIDER_SITE_OTHER): Payer: BC Managed Care – PPO | Admitting: Surgery

## 2012-02-04 ENCOUNTER — Encounter (INDEPENDENT_AMBULATORY_CARE_PROVIDER_SITE_OTHER): Payer: BC Managed Care – PPO | Admitting: Surgery

## 2012-02-22 ENCOUNTER — Encounter (HOSPITAL_COMMUNITY): Payer: Self-pay | Admitting: Pharmacy Technician

## 2012-02-27 ENCOUNTER — Encounter (HOSPITAL_COMMUNITY): Payer: Self-pay

## 2012-02-27 ENCOUNTER — Encounter (HOSPITAL_COMMUNITY)
Admission: RE | Admit: 2012-02-27 | Discharge: 2012-02-27 | Disposition: A | Discharge status: Home / Self Care | Payer: Medicare Other | Source: Ambulatory Visit | Attending: Orthopedic Surgery | Admitting: Orthopedic Surgery

## 2012-02-27 ENCOUNTER — Ambulatory Visit (HOSPITAL_COMMUNITY)
Admission: RE | Admit: 2012-02-27 | Discharge: 2012-02-27 | Disposition: A | Discharge status: Home / Self Care | Payer: Medicare Other | Source: Ambulatory Visit | Attending: Orthopedic Surgery | Admitting: Orthopedic Surgery

## 2012-02-27 ENCOUNTER — Other Ambulatory Visit: Payer: Self-pay | Admitting: Orthopedic Surgery

## 2012-02-27 DIAGNOSIS — Z01810 Encounter for preprocedural cardiovascular examination: Secondary | ICD-10-CM | POA: Insufficient documentation

## 2012-02-27 DIAGNOSIS — Z01812 Encounter for preprocedural laboratory examination: Secondary | ICD-10-CM | POA: Insufficient documentation

## 2012-02-27 DIAGNOSIS — Z01818 Encounter for other preprocedural examination: Secondary | ICD-10-CM | POA: Insufficient documentation

## 2012-02-27 HISTORY — DX: Essential (primary) hypertension: I10

## 2012-02-27 HISTORY — DX: Pure hypercholesterolemia, unspecified: E78.00

## 2012-02-27 LAB — TYPE AND SCREEN
ABO/RH(D): A POS
Antibody Screen: NEGATIVE

## 2012-02-27 LAB — URINALYSIS, ROUTINE W REFLEX MICROSCOPIC
Bilirubin Urine: NEGATIVE
Specific Gravity, Urine: 1.018 (ref 1.005–1.030)
pH: 6 (ref 5.0–8.0)

## 2012-02-27 LAB — URINE MICROSCOPIC-ADD ON

## 2012-02-27 LAB — CBC
HCT: 43.4 % (ref 39.0–52.0)
MCH: 31.6 pg (ref 26.0–34.0)
MCV: 89.1 fL (ref 78.0–100.0)
Platelets: 227 10*3/uL (ref 150–400)
RBC: 4.87 MIL/uL (ref 4.22–5.81)

## 2012-02-27 LAB — SURGICAL PCR SCREEN: Staphylococcus aureus: NEGATIVE

## 2012-02-27 LAB — COMPREHENSIVE METABOLIC PANEL
AST: 19 U/L (ref 0–37)
BUN: 16 mg/dL (ref 6–23)
CO2: 27 mEq/L (ref 19–32)
Calcium: 9.7 mg/dL (ref 8.4–10.5)
Creatinine, Ser: 0.71 mg/dL (ref 0.50–1.35)
GFR calc Af Amer: >90 mL/min (ref >90)
GFR calc non Af Amer: >90 mL/min (ref >90)

## 2012-02-27 LAB — ABO/RH: ABO/RH(D): A POS

## 2012-02-27 NOTE — Progress Notes (Addendum)
Patient states he goes to see Dr. Sharyn Lull every 3 months...he is currently on hi cholesterol meds and i questioned his going so often...he denies any chest pain or heart problems...."just the way we set it up"......Marland KitchenDA Regarding the seizures...he has hx of migraines and gets an 'aura' beforehand..First one was in 2005 and the 2nd was in 2011....none since and takes Depakote *(at night).Marland KitchenMarland KitchenSees someone "in that neurological group over there."  No definite dx has been determined...Marland KitchenDA

## 2012-02-27 NOTE — Pre-Procedure Instructions (Signed)
TAJ NEVINS  02/27/2012   Your procedure is scheduled on:  Monday, Feb. 24th   Report to [facility] at 11:00 AM.  Call this number if you have problems the morning of surgery:[phone]   Remember:   Do not eat food or drink liquids after midnight Sunday.   Take these medicines the morning of surgery with A SIP OF WATER: none   Do not wear jewelry.  Do not wear lotions, powders, or colognes. You may NOT wear deodorant.   Men may shave face and neck.   Do not bring valuables to the hospital.  Contacts, dentures or bridgework may not be worn into surgery.   Leave suitcase in the car. After surgery it may be brought to your room.  For patients admitted to the hospital, checkout time is 11:00 AM the day of discharge.   Name and phone number of your driver:    Special Instructions: Shower using CHG 2 nights before surgery and the night before surgery.  If you shower the day of surgery use CHG.  Use special wash - you have one bottle of CHG for all showers.  You should use approximately 1/3 of the bottle for each shower.   Please read over the following fact sheets that you were given: Pain Booklet, Coughing and Deep Breathing, Blood Transfusion Information, MRSA Information and Surgical Site Infection Prevention

## 2012-03-02 MED ORDER — CEFAZOLIN SODIUM-DEXTROSE 2-3 GM-% IV SOLR
2.0000 g | INTRAVENOUS | Status: AC
  Administered 2012-03-03: 2 g via INTRAVENOUS
  Filled 2012-03-02: qty 50

## 2012-03-03 ENCOUNTER — Encounter (HOSPITAL_COMMUNITY): Payer: Self-pay | Admitting: Anesthesiology

## 2012-03-03 ENCOUNTER — Inpatient Hospital Stay (HOSPITAL_COMMUNITY)
Admission: RE | Admit: 2012-03-03 | Discharge: 2012-03-12 | DRG: 470 | Disposition: A | Discharge status: Home Health | Payer: Medicare Other | Source: Ambulatory Visit | Attending: Orthopedic Surgery | Admitting: Orthopedic Surgery

## 2012-03-03 ENCOUNTER — Encounter (HOSPITAL_COMMUNITY)
Admission: RE | Disposition: A | Discharge status: Home Health | Payer: Self-pay | Source: Ambulatory Visit | Attending: Orthopedic Surgery

## 2012-03-03 ENCOUNTER — Inpatient Hospital Stay (HOSPITAL_COMMUNITY): Payer: Medicare Other | Admitting: Anesthesiology

## 2012-03-03 ENCOUNTER — Inpatient Hospital Stay (HOSPITAL_COMMUNITY): Payer: Medicare Other

## 2012-03-03 DIAGNOSIS — Z79899 Other long term (current) drug therapy: Secondary | ICD-10-CM

## 2012-03-03 DIAGNOSIS — Z87891 Personal history of nicotine dependence: Secondary | ICD-10-CM

## 2012-03-03 DIAGNOSIS — R410 Disorientation, unspecified: Secondary | ICD-10-CM | POA: Diagnosis not present

## 2012-03-03 DIAGNOSIS — E7800 Pure hypercholesterolemia, unspecified: Secondary | ICD-10-CM | POA: Diagnosis present

## 2012-03-03 DIAGNOSIS — F05 Delirium due to known physiological condition: Secondary | ICD-10-CM | POA: Diagnosis not present

## 2012-03-03 DIAGNOSIS — M1710 Unilateral primary osteoarthritis, unspecified knee: Principal | ICD-10-CM | POA: Diagnosis present

## 2012-03-03 DIAGNOSIS — C679 Malignant neoplasm of bladder, unspecified: Secondary | ICD-10-CM | POA: Diagnosis present

## 2012-03-03 DIAGNOSIS — Z7982 Long term (current) use of aspirin: Secondary | ICD-10-CM

## 2012-03-03 DIAGNOSIS — N179 Acute kidney failure, unspecified: Secondary | ICD-10-CM | POA: Diagnosis not present

## 2012-03-03 DIAGNOSIS — IMO0002 Reserved for concepts with insufficient information to code with codable children: Secondary | ICD-10-CM | POA: Diagnosis not present

## 2012-03-03 DIAGNOSIS — I10 Essential (primary) hypertension: Secondary | ICD-10-CM | POA: Diagnosis present

## 2012-03-03 DIAGNOSIS — J45909 Unspecified asthma, uncomplicated: Secondary | ICD-10-CM | POA: Diagnosis present

## 2012-03-03 DIAGNOSIS — I959 Hypotension, unspecified: Secondary | ICD-10-CM | POA: Diagnosis not present

## 2012-03-03 DIAGNOSIS — K648 Other hemorrhoids: Secondary | ICD-10-CM | POA: Diagnosis present

## 2012-03-03 DIAGNOSIS — R569 Unspecified convulsions: Secondary | ICD-10-CM | POA: Diagnosis present

## 2012-03-03 DIAGNOSIS — S8010XA Contusion of unspecified lower leg, initial encounter: Secondary | ICD-10-CM

## 2012-03-03 DIAGNOSIS — Z8551 Personal history of malignant neoplasm of bladder: Secondary | ICD-10-CM

## 2012-03-03 DIAGNOSIS — M1711 Unilateral primary osteoarthritis, right knee: Secondary | ICD-10-CM | POA: Diagnosis present

## 2012-03-03 DIAGNOSIS — E785 Hyperlipidemia, unspecified: Secondary | ICD-10-CM | POA: Diagnosis present

## 2012-03-03 DIAGNOSIS — Y831 Surgical operation with implant of artificial internal device as the cause of abnormal reaction of the patient, or of later complication, without mention of misadventure at the time of the procedure: Secondary | ICD-10-CM | POA: Diagnosis not present

## 2012-03-03 DIAGNOSIS — D62 Acute posthemorrhagic anemia: Secondary | ICD-10-CM | POA: Diagnosis not present

## 2012-03-03 DIAGNOSIS — Y9210 Unspecified residential institution as the place of occurrence of the external cause: Secondary | ICD-10-CM | POA: Diagnosis not present

## 2012-03-03 HISTORY — PX: TOTAL KNEE ARTHROPLASTY: SHX125

## 2012-03-03 HISTORY — DX: Unilateral primary osteoarthritis, right knee: M17.11

## 2012-03-03 LAB — CBC
HCT: 40 % (ref 39.0–52.0)
MCH: 31.9 pg (ref 26.0–34.0)
MCHC: 35.8 g/dL (ref 30.0–36.0)
MCV: 89.3 fL (ref 78.0–100.0)
Platelets: 179 10*3/uL (ref 150–400)
RDW: 12.7 % (ref 11.5–15.5)

## 2012-03-03 SURGERY — ARTHROPLASTY, KNEE, TOTAL
Anesthesia: General | Site: Knee | Laterality: Right | Wound class: Clean

## 2012-03-03 MED ORDER — PROMETHAZINE HCL 25 MG PO TABS: 25.0000 mg | ORAL_TABLET | Freq: Four times a day (QID) | ORAL | Status: DC | PRN

## 2012-03-03 MED ORDER — OXYCODONE-ACETAMINOPHEN 10-325 MG PO TABS: 1.0000 | ORAL_TABLET | Freq: Four times a day (QID) | ORAL | Status: DC | PRN

## 2012-03-03 MED ORDER — MENTHOL 3 MG MT LOZG: 1.0000 | LOZENGE | OROMUCOSAL | Status: DC | PRN

## 2012-03-03 MED ORDER — SENNA 8.6 MG PO TABS
1.0000 | ORAL_TABLET | Freq: Two times a day (BID) | ORAL | Status: DC
  Administered 2012-03-03 – 2012-03-12 (×18): 8.6 mg via ORAL
  Filled 2012-03-03 (×19): qty 1

## 2012-03-03 MED ORDER — PROPOFOL 10 MG/ML IV BOLUS
INTRAVENOUS | Status: DC | PRN
  Administered 2012-03-03: 20 mg via INTRAVENOUS
  Administered 2012-03-03: 150 mg via INTRAVENOUS

## 2012-03-03 MED ORDER — METHOCARBAMOL 100 MG/ML IJ SOLN
500.0000 mg | Freq: Four times a day (QID) | INTRAVENOUS | Status: DC | PRN
  Filled 2012-03-03: qty 5

## 2012-03-03 MED ORDER — MIDAZOLAM HCL 2 MG/2ML IJ SOLN: 0.5000 mg | Freq: Once | INTRAMUSCULAR | Status: DC | PRN

## 2012-03-03 MED ORDER — ACETAMINOPHEN 10 MG/ML IV SOLN
INTRAVENOUS | Status: AC
  Filled 2012-03-03: qty 100

## 2012-03-03 MED ORDER — COUMADIN BOOK
1.0000 | Freq: Once | Status: DC
  Filled 2012-03-03: qty 1

## 2012-03-03 MED ORDER — DIPHENHYDRAMINE HCL 12.5 MG/5ML PO ELIX
12.5000 mg | ORAL_SOLUTION | ORAL | Status: DC | PRN
  Administered 2012-03-09 – 2012-03-10 (×2): 25 mg via ORAL
  Filled 2012-03-03 (×2): qty 10

## 2012-03-03 MED ORDER — ACETAMINOPHEN 325 MG PO TABS
650.0000 mg | ORAL_TABLET | Freq: Four times a day (QID) | ORAL | Status: DC | PRN
  Administered 2012-03-06: 650 mg via ORAL
  Filled 2012-03-03: qty 2

## 2012-03-03 MED ORDER — ENOXAPARIN SODIUM 30 MG/0.3ML ~~LOC~~ SOLN
30.0000 mg | Freq: Two times a day (BID) | SUBCUTANEOUS | Status: DC
  Administered 2012-03-04 – 2012-03-05 (×4): 30 mg via SUBCUTANEOUS
  Filled 2012-03-03 (×7): qty 0.3

## 2012-03-03 MED ORDER — ACETAMINOPHEN 10 MG/ML IV SOLN
1000.0000 mg | Freq: Four times a day (QID) | INTRAVENOUS | Status: AC
  Administered 2012-03-03 – 2012-03-04 (×4): 1000 mg via INTRAVENOUS
  Filled 2012-03-03 (×4): qty 100

## 2012-03-03 MED ORDER — SODIUM CHLORIDE 0.9 % IR SOLN
Status: DC | PRN
  Administered 2012-03-03: 3000 mL
  Administered 2012-03-03: 1000 mL

## 2012-03-03 MED ORDER — NEOSTIGMINE METHYLSULFATE 1 MG/ML IJ SOLN
INTRAMUSCULAR | Status: DC | PRN
  Administered 2012-03-03: 3 mg via INTRAVENOUS

## 2012-03-03 MED ORDER — BUPIVACAINE HCL (PF) 0.25 % IJ SOLN
INTRAMUSCULAR | Status: AC
  Filled 2012-03-03: qty 30

## 2012-03-03 MED ORDER — PHENOL 1.4 % MT LIQD: 1.0000 | OROMUCOSAL | Status: DC | PRN

## 2012-03-03 MED ORDER — SORBITOL 70 % SOLN
30.0000 mL | Freq: Every day | Status: DC | PRN
  Administered 2012-03-07: 30 mL via ORAL
  Filled 2012-03-03: qty 30

## 2012-03-03 MED ORDER — HYDROMORPHONE HCL PF 1 MG/ML IJ SOLN
1.0000 mg | INTRAMUSCULAR | Status: DC | PRN
  Administered 2012-03-04 – 2012-03-07 (×6): 1 mg via INTRAVENOUS
  Filled 2012-03-03 (×6): qty 1

## 2012-03-03 MED ORDER — METOCLOPRAMIDE HCL 10 MG PO TABS: 5.0000 mg | ORAL_TABLET | Freq: Three times a day (TID) | ORAL | Status: DC | PRN

## 2012-03-03 MED ORDER — FENTANYL CITRATE 0.05 MG/ML IJ SOLN
INTRAMUSCULAR | Status: DC | PRN
  Administered 2012-03-03: 50 ug via INTRAVENOUS
  Administered 2012-03-03 (×2): 100 ug via INTRAVENOUS
  Administered 2012-03-03: 50 ug via INTRAVENOUS
  Administered 2012-03-03: 100 ug via INTRAVENOUS

## 2012-03-03 MED ORDER — MIDAZOLAM HCL 2 MG/2ML IJ SOLN
INTRAMUSCULAR | Status: AC
  Filled 2012-03-03: qty 2

## 2012-03-03 MED ORDER — METHOCARBAMOL 500 MG PO TABS
500.0000 mg | ORAL_TABLET | Freq: Four times a day (QID) | ORAL | Status: DC | PRN
  Administered 2012-03-03 – 2012-03-08 (×12): 500 mg via ORAL
  Filled 2012-03-03 (×11): qty 1

## 2012-03-03 MED ORDER — SENNA-DOCUSATE SODIUM 8.6-50 MG PO TABS: 1.0000 | ORAL_TABLET | Freq: Every day | ORAL | Status: DC

## 2012-03-03 MED ORDER — ROCURONIUM BROMIDE 100 MG/10ML IV SOLN
INTRAVENOUS | Status: DC | PRN
  Administered 2012-03-03: 50 mg via INTRAVENOUS

## 2012-03-03 MED ORDER — PROMETHAZINE HCL 25 MG/ML IJ SOLN: 6.2500 mg | INTRAMUSCULAR | Status: DC | PRN

## 2012-03-03 MED ORDER — WARFARIN - PHARMACIST DOSING INPATIENT: Freq: Every day | Status: DC

## 2012-03-03 MED ORDER — DIVALPROEX SODIUM ER 500 MG PO TB24
1000.0000 mg | ORAL_TABLET | Freq: Every day | ORAL | Status: DC
  Administered 2012-03-03 – 2012-03-11 (×9): 1000 mg via ORAL
  Filled 2012-03-03 (×10): qty 2

## 2012-03-03 MED ORDER — POLYETHYLENE GLYCOL 3350 17 G PO PACK
17.0000 g | PACK | Freq: Every day | ORAL | Status: DC | PRN
  Administered 2012-03-10: 17 g via ORAL
  Filled 2012-03-03: qty 1

## 2012-03-03 MED ORDER — LIDOCAINE HCL (CARDIAC) 20 MG/ML IV SOLN
INTRAVENOUS | Status: DC | PRN
  Administered 2012-03-03: 20 mg via INTRAVENOUS

## 2012-03-03 MED ORDER — OXYCODONE HCL 5 MG PO TABS
ORAL_TABLET | ORAL | Status: AC
  Filled 2012-03-03: qty 1

## 2012-03-03 MED ORDER — OXYCODONE HCL 5 MG PO TABS
5.0000 mg | ORAL_TABLET | Freq: Once | ORAL | Status: AC | PRN
  Administered 2012-03-03: 5 mg via ORAL

## 2012-03-03 MED ORDER — OXYCODONE HCL 5 MG/5ML PO SOLN: 5.0000 mg | Freq: Once | ORAL | Status: AC | PRN

## 2012-03-03 MED ORDER — MEPERIDINE HCL 25 MG/ML IJ SOLN: 6.2500 mg | INTRAMUSCULAR | Status: DC | PRN

## 2012-03-03 MED ORDER — KETOROLAC TROMETHAMINE 15 MG/ML IJ SOLN
7.5000 mg | Freq: Four times a day (QID) | INTRAMUSCULAR | Status: AC
  Administered 2012-03-03 – 2012-03-04 (×3): 7.5 mg via INTRAVENOUS
  Filled 2012-03-03 (×4): qty 1

## 2012-03-03 MED ORDER — ATORVASTATIN CALCIUM 40 MG PO TABS
40.0000 mg | ORAL_TABLET | Freq: Every day | ORAL | Status: DC
  Administered 2012-03-04 – 2012-03-11 (×8): 40 mg via ORAL
  Filled 2012-03-03 (×9): qty 1

## 2012-03-03 MED ORDER — ZOLPIDEM TARTRATE 5 MG PO TABS: 5.0000 mg | ORAL_TABLET | Freq: Every evening | ORAL | Status: DC | PRN

## 2012-03-03 MED ORDER — WARFARIN SODIUM 7.5 MG PO TABS
7.5000 mg | ORAL_TABLET | Freq: Once | ORAL | Status: AC
  Administered 2012-03-03: 7.5 mg via ORAL
  Filled 2012-03-03: qty 1

## 2012-03-03 MED ORDER — ACETAMINOPHEN 650 MG RE SUPP: 650.0000 mg | Freq: Four times a day (QID) | RECTAL | Status: DC | PRN

## 2012-03-03 MED ORDER — HYDROMORPHONE HCL PF 1 MG/ML IJ SOLN
INTRAMUSCULAR | Status: AC
  Filled 2012-03-03: qty 1

## 2012-03-03 MED ORDER — GLYCOPYRROLATE 0.2 MG/ML IJ SOLN
INTRAMUSCULAR | Status: DC | PRN
  Administered 2012-03-03: 0.4 mg via INTRAVENOUS

## 2012-03-03 MED ORDER — POTASSIUM CHLORIDE IN NACL 20-0.45 MEQ/L-% IV SOLN
INTRAVENOUS | Status: DC
  Administered 2012-03-03 – 2012-03-07 (×3): via INTRAVENOUS
  Filled 2012-03-03 (×15): qty 1000

## 2012-03-03 MED ORDER — ONDANSETRON HCL 4 MG/2ML IJ SOLN: 4.0000 mg | Freq: Four times a day (QID) | INTRAMUSCULAR | Status: DC | PRN

## 2012-03-03 MED ORDER — CEFAZOLIN SODIUM-DEXTROSE 2-3 GM-% IV SOLR
2.0000 g | Freq: Four times a day (QID) | INTRAVENOUS | Status: AC
  Administered 2012-03-03 – 2012-03-04 (×2): 2 g via INTRAVENOUS
  Filled 2012-03-03 (×2): qty 50

## 2012-03-03 MED ORDER — METHOCARBAMOL 500 MG PO TABS: 500.0000 mg | ORAL_TABLET | Freq: Four times a day (QID) | ORAL | Status: DC

## 2012-03-03 MED ORDER — ACETAMINOPHEN 10 MG/ML IV SOLN: 15.0000 mg/kg | Freq: Four times a day (QID) | INTRAVENOUS | Status: DC

## 2012-03-03 MED ORDER — ARTIFICIAL TEARS OP OINT
TOPICAL_OINTMENT | OPHTHALMIC | Status: DC | PRN
  Administered 2012-03-03: 1 via OPHTHALMIC

## 2012-03-03 MED ORDER — ALUM & MAG HYDROXIDE-SIMETH 200-200-20 MG/5ML PO SUSP: 30.0000 mL | ORAL | Status: DC | PRN

## 2012-03-03 MED ORDER — LACTATED RINGERS IV SOLN
INTRAVENOUS | Status: DC | PRN
  Administered 2012-03-03 (×2): via INTRAVENOUS

## 2012-03-03 MED ORDER — DOCUSATE SODIUM 100 MG PO CAPS
100.0000 mg | ORAL_CAPSULE | Freq: Two times a day (BID) | ORAL | Status: DC
  Administered 2012-03-03 – 2012-03-12 (×18): 100 mg via ORAL
  Filled 2012-03-03 (×21): qty 1

## 2012-03-03 MED ORDER — ONDANSETRON HCL 4 MG PO TABS: 4.0000 mg | ORAL_TABLET | Freq: Four times a day (QID) | ORAL | Status: DC | PRN

## 2012-03-03 MED ORDER — FENTANYL CITRATE 0.05 MG/ML IJ SOLN
INTRAMUSCULAR | Status: AC
  Filled 2012-03-03: qty 2

## 2012-03-03 MED ORDER — METHOCARBAMOL 500 MG PO TABS
ORAL_TABLET | ORAL | Status: AC
  Filled 2012-03-03: qty 1

## 2012-03-03 MED ORDER — WARFARIN VIDEO
1.0000 | Freq: Once | Status: AC
  Administered 2012-03-04: 1

## 2012-03-03 MED ORDER — MIDAZOLAM HCL 5 MG/5ML IJ SOLN
INTRAMUSCULAR | Status: DC | PRN
  Administered 2012-03-03: 2 mg via INTRAVENOUS

## 2012-03-03 MED ORDER — ONDANSETRON HCL 4 MG/2ML IJ SOLN
INTRAMUSCULAR | Status: DC | PRN
  Administered 2012-03-03: 4 mg via INTRAVENOUS

## 2012-03-03 MED ORDER — FLEET ENEMA 7-19 GM/118ML RE ENEM: 1.0000 | ENEMA | Freq: Once | RECTAL | Status: AC | PRN

## 2012-03-03 MED ORDER — METOCLOPRAMIDE HCL 5 MG/ML IJ SOLN: 5.0000 mg | Freq: Three times a day (TID) | INTRAMUSCULAR | Status: DC | PRN

## 2012-03-03 MED ORDER — ACETAMINOPHEN 10 MG/ML IV SOLN
1000.0000 mg | Freq: Once | INTRAVENOUS | Status: AC
  Administered 2012-03-03: 1000 mg via INTRAVENOUS
  Filled 2012-03-03: qty 100

## 2012-03-03 MED ORDER — OXYCODONE HCL 5 MG PO TABS
5.0000 mg | ORAL_TABLET | ORAL | Status: DC | PRN
  Administered 2012-03-03 – 2012-03-08 (×20): 10 mg via ORAL
  Filled 2012-03-03 (×21): qty 2

## 2012-03-03 MED ORDER — LACTATED RINGERS IV SOLN
INTRAVENOUS | Status: DC
  Administered 2012-03-03: 13:00:00 via INTRAVENOUS

## 2012-03-03 MED ORDER — WARFARIN SODIUM 5 MG PO TABS: 5.0000 mg | ORAL_TABLET | Freq: Every day | ORAL | Status: DC

## 2012-03-03 MED ORDER — HYDROMORPHONE HCL PF 1 MG/ML IJ SOLN
0.2500 mg | INTRAMUSCULAR | Status: DC | PRN
  Administered 2012-03-03 (×3): 0.5 mg via INTRAVENOUS

## 2012-03-03 MED ORDER — IRBESARTAN 150 MG PO TABS
150.0000 mg | ORAL_TABLET | Freq: Every day | ORAL | Status: DC
  Administered 2012-03-03 – 2012-03-09 (×3): 150 mg via ORAL
  Filled 2012-03-03 (×7): qty 1

## 2012-03-03 SURGICAL SUPPLY — 58 items
APL SKNCLS STERI-STRIP NONHPOA (GAUZE/BANDAGES/DRESSINGS) ×1
BANDAGE ELASTIC 6 VELCRO ST LF (GAUZE/BANDAGES/DRESSINGS) ×3 IMPLANT
BANDAGE ESMARK 6X9 LF (GAUZE/BANDAGES/DRESSINGS) ×1 IMPLANT
BENZOIN TINCTURE PRP APPL 2/3 (GAUZE/BANDAGES/DRESSINGS) ×2 IMPLANT
BLADE SAG 18X100X1.27 (BLADE) ×2 IMPLANT
BLADE SAW RECIP 87.9 MT (BLADE) ×2 IMPLANT
BLADE SAW SGTL 13X75X1.27 (BLADE) ×2 IMPLANT
BNDG CMPR 9X6 STRL LF SNTH (GAUZE/BANDAGES/DRESSINGS) ×1
BNDG ESMARK 6X9 LF (GAUZE/BANDAGES/DRESSINGS) ×2
BOOTCOVER CLEANROOM LRG (PROTECTIVE WEAR) ×4 IMPLANT
BOWL SMART MIX CTS (DISPOSABLE) ×2 IMPLANT
CEMENT HV SMART SET (Cement) ×4 IMPLANT
CLOSURE STERI-STRIP 1/4X4 (GAUZE/BANDAGES/DRESSINGS) ×1 IMPLANT
CLOTH BEACON ORANGE TIMEOUT ST (SAFETY) ×2 IMPLANT
COVER SURGICAL LIGHT HANDLE (MISCELLANEOUS) ×2 IMPLANT
CUFF TOURNIQUET SINGLE 34IN LL (TOURNIQUET CUFF) ×1 IMPLANT
DRAPE EXTREMITY T 121X128X90 (DRAPE) ×2 IMPLANT
DRAPE PROXIMA HALF (DRAPES) ×2 IMPLANT
DRAPE U-SHAPE 47X51 STRL (DRAPES) ×2 IMPLANT
DRSG PAD ABDOMINAL 8X10 ST (GAUZE/BANDAGES/DRESSINGS) ×2 IMPLANT
DURAPREP 26ML APPLICATOR (WOUND CARE) ×2 IMPLANT
ELECT CAUTERY BLADE 6.4 (BLADE) ×2 IMPLANT
ELECT REM PT RETURN 9FT ADLT (ELECTROSURGICAL) ×2
ELECTRODE REM PT RTRN 9FT ADLT (ELECTROSURGICAL) ×1 IMPLANT
EVACUATOR 1/8 PVC DRAIN (DRAIN) ×1 IMPLANT
FACESHIELD LNG OPTICON STERILE (SAFETY) ×2 IMPLANT
GLOVE BIOGEL PI ORTHO PRO SZ8 (GLOVE) ×2
GLOVE ORTHO TXT STRL SZ7.5 (GLOVE) ×2 IMPLANT
GLOVE PI ORTHO PRO STRL SZ8 (GLOVE) ×2 IMPLANT
GLOVE SURG ORTHO 8.0 STRL STRW (GLOVE) ×2 IMPLANT
HANDPIECE INTERPULSE COAX TIP (DISPOSABLE) ×2
HOOD PEEL AWAY FACE SHEILD DIS (HOOD) ×4 IMPLANT
KIT BASIN OR (CUSTOM PROCEDURE TRAY) ×2 IMPLANT
KIT ROOM TURNOVER OR (KITS) ×2 IMPLANT
MANIFOLD NEPTUNE II (INSTRUMENTS) ×2 IMPLANT
NS IRRIG 1000ML POUR BTL (IV SOLUTION) ×2 IMPLANT
PACK TOTAL JOINT (CUSTOM PROCEDURE TRAY) ×2 IMPLANT
PAD ARMBOARD 7.5X6 YLW CONV (MISCELLANEOUS) ×4 IMPLANT
PAD CAST 4YDX4 CTTN HI CHSV (CAST SUPPLIES) ×1 IMPLANT
PADDING CAST COTTON 4X4 STRL (CAST SUPPLIES) ×2
PADDING CAST COTTON 6X4 STRL (CAST SUPPLIES) ×2 IMPLANT
SET HNDPC FAN SPRY TIP SCT (DISPOSABLE) ×1 IMPLANT
SPONGE GAUZE 4X4 12PLY (GAUZE/BANDAGES/DRESSINGS) ×2 IMPLANT
STAPLER VISISTAT 35W (STAPLE) ×2 IMPLANT
STRIP CLOSURE SKIN 1/2X4 (GAUZE/BANDAGES/DRESSINGS) ×2 IMPLANT
SUCTION FRAZIER TIP 10 FR DISP (SUCTIONS) ×2 IMPLANT
SUT MNCRL AB 4-0 PS2 18 (SUTURE) IMPLANT
SUT VIC AB 0 CT1 27 (SUTURE) ×2
SUT VIC AB 0 CT1 27XBRD ANBCTR (SUTURE) ×1 IMPLANT
SUT VIC AB 2-0 CT1 27 (SUTURE) ×2
SUT VIC AB 2-0 CT1 TAPERPNT 27 (SUTURE) ×1 IMPLANT
SUT VIC AB 3-0 SH 18 (SUTURE) ×2 IMPLANT
SUT VIC AB 3-0 SH 8-18 (SUTURE) ×1 IMPLANT
SYR 30ML LL (SYRINGE) ×2 IMPLANT
TOWEL OR 17X24 6PK STRL BLUE (TOWEL DISPOSABLE) ×2 IMPLANT
TOWEL OR 17X26 10 PK STRL BLUE (TOWEL DISPOSABLE) ×2 IMPLANT
TRAY FOLEY CATH 14FR (SET/KITS/TRAYS/PACK) ×1 IMPLANT
WATER STERILE IRR 1000ML POUR (IV SOLUTION) ×3 IMPLANT

## 2012-03-03 NOTE — Consult Note (Signed)
ANTICOAGULATION CONSULT NOTE - Initial Consult  Pharmacy Consult for Coumadin Indication: VTE prophylaxis s/p R-TKA  Allergies: No Known Allergies  Height/Weight: Dosing weight 88.2 kg  Vital Signs: BP 131/83  Pulse 46  Temp(Src) 97 F (36.1 C) (Oral)  Resp 11  SpO2 100%  Labs:  Recent Labs  03/03/12 1809  HGB 14.3  HCT 40.0  PLT 179   Lab Results  Component Value Date   INR 0.98 02/27/2012   Medical / Surgical History: Past Medical History  Diagnosis Date  . Bladder cancer     Dr. Annabell Howells, [facility]  . Hyperlipidemia   . Seizures   . Asthma     mainly as a child  . Hypertension     sees  dr.  Sharyn Lull  . High cholesterol   . Osteoarthritis of right knee 03/03/2012   Past Surgical History  Procedure Laterality Date  . Rotator cuff repair  07/2009    right  . Bladder surgery  02/2005    TURBT   . Anal fissure repair  09/20/2011    Lat sphincterotomy  . Hemorrhoid surgery  09/20/2011    Int hemorrhoidectomy x 2    Medications:  Prescriptions prior to admission  Medication Sig Dispense Refill  . CRESTOR 20 MG tablet Take 20 mg by mouth at bedtime.       . divalproex (DEPAKOTE ER) 500 MG 24 hr tablet Take 1,000 mg by mouth at bedtime.       Marland Kitchen MICARDIS 40 MG tablet Take 40 mg by mouth every morning.        Scheduled:  . [COMPLETED] acetaminophen  1,000 mg Intravenous Once  . acetaminophen  1,000 mg Intravenous Q6H  . [START ON 03/04/2012] atorvastatin  40 mg Oral q1800  . [COMPLETED]  ceFAZolin (ANCEF) IV  2 g Intravenous On Call to OR  .  ceFAZolin (ANCEF) IV  2 g Intravenous Q6H  . divalproex  1,000 mg Oral QHS  . docusate sodium  100 mg Oral BID  . [START ON 03/04/2012] enoxaparin (LOVENOX) injection  30 mg Subcutaneous Q12H  . HYDROmorphone      . HYDROmorphone      . irbesartan  150 mg Oral Daily  . ketorolac  7.5 mg Intravenous Q6H  . methocarbamol      . oxyCODONE      . senna  1 tablet Oral BID  . [DISCONTINUED] acetaminophen  15 mg/kg  Intravenous Q6H    Assessment:  61 y.o.male with Osteoarthritis of right knee s/p R-TKA to be placed on Coumadin for VTE prophylaxis with Lovenox bridging..  Baseline INR < 1.  Coumadin score 5.  Goal of Therapy:   INR 2-3      Plan:   Coumadin 7.5 mg tonight.  Begin Lovenox bridging 30 mg sq q 12 hours, first dose in AM  Coumadin video, coumadin booklet Daily INR's, CBC.   Skye Plamondon, Elisha Headland,  Pharm.D.. 03/03/2012,  6:54 PM

## 2012-03-03 NOTE — Anesthesia Postprocedure Evaluation (Signed)
  Anesthesia Post-op Note  Patient: Todd Howell  Procedure(s) Performed: Procedure(s): TOTAL KNEE ARTHROPLASTY (Right)  Patient Location: PACU  Anesthesia Type:GA combined with regional for post-op pain  Level of Consciousness: awake, alert , oriented and patient cooperative  Airway and Oxygen Therapy: Patient Spontanous Breathing and Patient connected to nasal cannula oxygen  Post-op Pain: mild  Post-op Assessment: Post-op Vital signs reviewed, Patient's Cardiovascular Status Stable, Respiratory Function Stable, Patent Airway, No signs of Nausea or vomiting and Pain level controlled  Post-op Vital Signs: Reviewed and stable  Complications: No apparent anesthesia complications

## 2012-03-03 NOTE — Op Note (Signed)
DATE OF SURGERY:  03/03/2012 TIME: 3:43 PM  PATIENT NAME:  Todd Howell   AGE: 62 y.o.    PRE-OPERATIVE DIAGNOSIS:  DJD RIGHT KNEE, valgus  POST-OPERATIVE DIAGNOSIS:  Same  PROCEDURE:  Procedure(s): TOTAL KNEE ARTHROPLASTY   SURGEON:  Eulas Post, MD   ASSISTANT:  Janace Litten, OPA-C, present and scrubbed throughout the case, critical for assistance with exposure, retraction, instrumentation, and closure.   OPERATIVE IMPLANTS: Depuy PFC Sigma, Posterior Stabilized.  Femur size 5, Tibia size 4, Patella size 38 3-peg oval button, with a 10 mm polyethylene insert.   PREOPERATIVE INDICATIONS:  Todd Howell is a 62 y.o. year old male with end stage bone on bone degenerative arthritis of the knee who failed conservative treatment, including injections, antiinflammatories, activity modification, and assistive devices, and had significant impairment of their activities of daily living, and elected for Total Knee Arthroplasty. He had a severe fracture flexion contracture, at least 30, prior to surgery.  The risks, benefits, and alternatives were discussed at length including but not limited to the risks of infection, bleeding, nerve injury, stiffness, blood clots, the need for revision surgery, cardiopulmonary complications, among others, and they were willing to proceed.   OPERATIVE DESCRIPTION:  The patient was brought to the operative room and placed in a supine position.  General anesthesia was administered.  IV antibiotics were given.  The lower extremity was prepped and draped in the usual sterile fashion.  Time out was performed.  The leg was elevated and exsanguinated and the tourniquet was inflated.  Anterior quadriceps tendon splitting approach was performed.  The patella was everted and osteophytes were removed.  The anterior horn of the medial and lateral meniscus was removed.   The distal femur was opened with the drill and the intramedullary distal femoral cutting  jig was utilized, set at 5 degrees resecting 11 mm off the distal femur.  Care was taken to protect the collateral ligaments.  Then the extramedullary tibial cutting jig was utilized making the appropriate cut using the anterior tibial crest as a reference building in appropriate posterior slope.  Care was taken during the cut to protect the medial and collateral ligaments.  The proximal tibia was removed along with the posterior horns of the menisci.  The PCL was sacrificed.    The extensor gap was measured and was extremely tight, and I went back and resected another 2 mm off of the femur. I then tested the gap, and it was still too tight, and so I resected another 2 mm off of the tibia. I measured the extension gap, and it was still too tight, so I had to do a third cut on the tibia. I did not want to do it on the femur, for fear of changing the joint line. I had to repin the jig, with a second set of pins, by marching it down, in order to do the third cut. Finally, the extension gap measured approximately 10mm.  I was able to get the knee within 5 of full extension, which is much improved compared to the greater than 30 flexion contracture.  The distal femoral sizing jig was applied, taking care to avoid notching.  The size measured to approximately a 5, and I evaluated the axis of rotation, based on Whitesides line, and the intercondylar axis. The jig would have significantly internally rotated the femoral component, due to the severe lateral femoral condyle hypoplasia. Therefore I externally rotated the jig for pinning it, bringing it off  of the lateral condyle in the back in order to allow for appropriate rotation. Then the 4-in-1 cutting jig was applied and the anterior and posterior femur was cut, along with the chamfer cuts.  There was no notching. The lateral cuts were fairly minimal, just scraping the posterior condyle. All posterior osteophytes were removed.  The flexion gap was then measured  and was symmetric with the extension gap.  I completed the distal femoral preparation using the appropriate jig to prepare the box.  The patella was then measured, and cut with the saw.  It measured 27 mm before the cut and 17 mm afterwards.  The proximal tibia sized and prepared accordingly with the reamer and the punch, and then all components were trialed with the 10mm poly insert.  The knee was found to have excellent balance and full motion.    The above named components were then cemented into place and all excess cement was removed.  The trial polyethylene component was in place during cementation, and then was exchanged for the real polyethylene component.    The knee was easily taken through a range of motion and the patella tracked well and the knee irrigated copiously and the parapatellar and subcutaneous tissue closed with vicryl, and monocryl with steri strips for the skin.  The wounds were injected with marcaine, and dressed with sterile gauze and the tourniquet released and the patient was awakened and returned to the PACU in stable and satisfactory condition.  There were no complications.  Total tourniquet time was ~110 minutes.

## 2012-03-03 NOTE — Progress Notes (Signed)
Orthopedic Tech Progress Note Patient Details:  Todd Howell 1950-12-27 [id number]  Patient ID: Todd Howell, male   DOB: [birth year], 62 y.o.   MRN: [id number] Trapeze bar patient helper;viewed order from rn order list  Nikki Dom 03/03/2012, 5:13 PM

## 2012-03-03 NOTE — Anesthesia Procedure Notes (Addendum)
Anesthesia Regional Block:  Femoral nerve block  Pre-Anesthetic Checklist: ,, timeout performed, Correct Patient, Correct Site, Correct Laterality, Correct Procedure, Correct Position, site marked, Risks and benefits discussed,  Surgical consent,  Pre-op evaluation,  At surgeon's request and post-op pain management  Laterality: Right  Prep: chloraprep       Needles:  Injection technique: Single-shot  Needle Type: Stimulator Needle - 40     Needle Length: 4cm  Needle Gauge: 22 and 22 G    Additional Needles:  Procedures: nerve stimulator Femoral nerve block  Nerve Stimulator or Paresthesia:  Response: patella twitch, 0.45 mA, 0.1 ms,   Additional Responses:   Narrative:  Start time: 03/03/2012 1:10 PM End time: 03/03/2012 1:16 PM Injection made incrementally with aspirations every 5 mL.  Performed by: Personally  Anesthesiologist: Sandford Craze, MD  Additional Notes: Pt identified in Holding room.  Monitors applied. Working IV access confirmed. Sterile prep R groin.  #22ga PNS to patellar twitch at 0.25mA threshold.  30cc 0.5% Bupivacaine with 1:200k epi injected incrementally after negative test dose.  Patient asymptomatic, VSS, no heme aspirated, tolerated well.  Sandford Craze, MD  Femoral nerve block Procedure Name: Intubation Date/Time: 03/03/2012 1:44 PM Performed by: Fransisca Kaufmann Pre-anesthesia Checklist: Patient identified, Emergency Drugs available, Suction available, Patient being monitored and Timeout performed Patient Re-evaluated:Patient Re-evaluated prior to inductionOxygen Delivery Method: Circle system utilized Preoxygenation: Pre-oxygenation with 100% oxygen Intubation Type: IV induction Ventilation: Mask ventilation without difficulty Laryngoscope Size: Miller and 3 Grade View: Grade II Tube type: Oral Tube size: 7.5 mm Number of attempts: 1 Airway Equipment and Method: Stylet Placement Confirmation: ETT inserted through vocal cords under direct vision,   positive ETCO2 and breath sounds checked- equal and bilateral Secured at: 23 cm Tube secured with: Tape Dental Injury: Teeth and Oropharynx as per pre-operative assessment

## 2012-03-03 NOTE — Anesthesia Preprocedure Evaluation (Signed)
Anesthesia Evaluation  Patient identified by MRN, date of birth, ID band Patient awake    Reviewed: Allergy & Precautions, H&P , NPO status , Patient's Chart, lab work & pertinent test results, reviewed documented beta blocker date and time   Airway Mallampati: I TM Distance: >3 FB Neck ROM: Full    Dental  (+) Teeth Intact, Caps and Dental Advisory Given   Pulmonary former smoker (quit 1988),  breath sounds clear to auscultation  Pulmonary exam normal       Cardiovascular hypertension, Pt. on medications - Peripheral Vascular Disease Rhythm:Regular Rate:Normal  '11 stress test: EF 59%, normal wall motion   Neuro/Psych Seizures - (last seizure 3 years ago), Well Controlled,     GI/Hepatic negative GI ROS, Neg liver ROS,   Endo/Other  negative endocrine ROS  Renal/GU negative Renal ROS     Musculoskeletal   Abdominal   Peds  Hematology   Anesthesia Other Findings   Reproductive/Obstetrics                           Anesthesia Physical Anesthesia Plan  ASA: II  Anesthesia Plan: General   Post-op Pain Management:    Induction: Intravenous  Airway Management Planned: Oral ETT  Additional Equipment:   Intra-op Plan:   Post-operative Plan: Extubation in OR  Informed Consent: I have reviewed the patients History and Physical, chart, labs and discussed the procedure including the risks, benefits and alternatives for the proposed anesthesia with the patient or authorized representative who has indicated his/her understanding and acceptance.   Dental advisory given  Plan Discussed with: Surgeon and CRNA  Anesthesia Plan Comments: (Plan routine monitors, GETA with femoral nerve block for post-op analgesia)        Anesthesia Quick Evaluation

## 2012-03-03 NOTE — Transfer of Care (Signed)
Immediate Anesthesia Transfer of Care Note  Patient: Todd Howell  Procedure(s) Performed: Procedure(s): TOTAL KNEE ARTHROPLASTY (Right)  Patient Location: PACU  Anesthesia Type:General and Regional  Level of Consciousness: awake, alert  and oriented  Airway & Oxygen Therapy: Patient Spontanous Breathing and Patient connected to nasal cannula oxygen  Post-op Assessment: Report given to PACU RN and Post -op Vital signs reviewed and stable  Post vital signs: Reviewed and stable  Complications: No apparent anesthesia complications

## 2012-03-03 NOTE — H&P (Signed)
PREOPERATIVE H&P  Chief Complaint: DJD RIGHT KNEE  HPI: Todd Howell is a 62 y.o. male who presents for preoperative history and physical with a diagnosis of DJD RIGHT KNEE. Symptoms are rated as moderate to severe, and have been worsening.  This is significantly impairing activities of daily living.  He has elected for surgical management. He has had injections, anti-inflammatories, bracing, activity modification, use of assistive devices, among others, and has failed.  Past Medical History  Diagnosis Date  . Bladder cancer     Dr. Annabell Howells, [facility]  . Hyperlipidemia   . Seizures   . Asthma     mainly as a child  . Hypertension     sees  dr.  Sharyn Lull  . High cholesterol    Past Surgical History  Procedure Laterality Date  . Rotator cuff repair  07/2009    right  . Bladder surgery  02/2005    TURBT   . Anal fissure repair  09/20/2011    Lat sphincterotomy  . Hemorrhoid surgery  09/20/2011    Int hemorrhoidectomy x 2   History   Social History  . Marital Status: Married    Spouse Name: N/A    Number of Children: N/A  . Years of Education: N/A   Social History Main Topics  . Smoking status: Former Smoker -- 1.00 packs/day for 30 years    Types: Cigarettes    Quit date: 07/02/1986  . Smokeless tobacco: Not on file  . Alcohol Use: 1.8 oz/week    1 Glasses of wine, 2 Cans of beer per week     Comment: 6drinks  . Drug Use: No  . Sexually Active: Not on file   Other Topics Concern  . Not on file   Social History Narrative  . No narrative on file   Family History  Problem Relation Age of Onset  . Cancer Mother     colon  . Cancer Father     colon/lung   No Known Allergies Prior to Admission medications   Medication Sig Start Date End Date Taking? Authorizing Provider  CRESTOR 20 MG tablet Take 20 mg by mouth at bedtime.  05/20/11  Yes Historical Provider, MD  divalproex (DEPAKOTE ER) 500 MG 24 hr tablet Take 1,000 mg by mouth at bedtime.  06/17/11  Yes  Historical Provider, MD  MICARDIS 40 MG tablet Take 40 mg by mouth every morning.  04/25/11  Yes Historical Provider, MD     Positive ROS: All other systems have been reviewed and were otherwise negative with the exception of those mentioned in the HPI and as above.  Physical Exam: General: Alert, no acute distress Cardiovascular: No pedal edema Respiratory: No cyanosis, no use of accessory musculature GI: No organomegaly, abdomen is soft and non-tender Skin: No lesions in the area of chief complaint Neurologic: Sensation intact distally Psychiatric: Patient is competent for consent with normal mood and affect Lymphatic: No axillary or cervical lymphadenopathy  MUSCULOSKELETAL: Right knee has moderate crepitance, mild valgus alignment, positive effusion, range of motion from 3 to 120.  Assessment: DJD RIGHT KNEE  Plan: Plan for Procedure(s): TOTAL KNEE ARTHROPLASTY  The risks benefits and alternatives were discussed with the patient including but not limited to the risks of nonoperative treatment, versus surgical intervention including infection, bleeding, nerve injury,  blood clots, cardiopulmonary complications, morbidity, mortality, among others, and they were willing to proceed. We've also discussed the potential for revision, incomplete relief of symptoms, anterolateral numbness, among others.  Eulas Post, MD Cell [phone] Pager [phone]  03/03/2012 12:34 PM

## 2012-03-03 NOTE — Preoperative (Signed)
Beta Blockers   Reason not to administer Beta Blockers:Not Applicable 

## 2012-03-04 ENCOUNTER — Encounter (HOSPITAL_COMMUNITY): Payer: Self-pay | Admitting: General Practice

## 2012-03-04 LAB — CBC
HCT: 34.7 % — ABNORMAL LOW (ref 39.0–52.0)
Hemoglobin: 12.1 g/dL — ABNORMAL LOW (ref 13.0–17.0)
MCH: 30.9 pg (ref 26.0–34.0)
MCV: 88.7 fL (ref 78.0–100.0)
Platelets: 152 10*3/uL (ref 150–400)
RBC: 3.91 MIL/uL — ABNORMAL LOW (ref 4.22–5.81)

## 2012-03-04 LAB — BASIC METABOLIC PANEL
BUN: 16 mg/dL (ref 6–23)
CO2: 25 mEq/L (ref 19–32)
Calcium: 8.7 mg/dL (ref 8.4–10.5)
Chloride: 97 mEq/L (ref 96–112)
Creatinine, Ser: 0.83 mg/dL (ref 0.50–1.35)
Glucose, Bld: 110 mg/dL — ABNORMAL HIGH (ref 70–99)

## 2012-03-04 LAB — PROTIME-INR: INR: 1.04 (ref 0.00–1.49)

## 2012-03-04 MED ORDER — WARFARIN SODIUM 7.5 MG PO TABS
7.5000 mg | ORAL_TABLET | Freq: Once | ORAL | Status: AC
  Administered 2012-03-04: 7.5 mg via ORAL
  Filled 2012-03-04: qty 1

## 2012-03-04 NOTE — Progress Notes (Signed)
Patient ID: Todd Howell, male   DOB: 1950-05-15, 62 y.o.   MRN: [id number]     Subjective:  Patient reports pain as mild to moderate.  Patient denies CP or SOB.  Sitting up eating lunch.  Objective:   VITALS:   Filed Vitals:   03/03/12 1715 03/03/12 1740 03/03/12 2348 03/04/12 0629  BP: 131/83 138/90 140/87 146/88  Pulse: 46 62 60 64  Temp: 97 F (36.1 C) 97.5 F (36.4 C) 97.7 F (36.5 C) 97.9 F (36.6 C)  TempSrc:      Resp: 11 16 18 18   SpO2: 100% 100% 100% 100%    ABD soft Sensation intact distally Dorsiflexion/Plantar flexion intact Incision: dressing C/D/I and no drainage   Lab Results  Component Value Date   WBC 10.3 03/04/2012   HGB 12.1* 03/04/2012   HCT 34.7* 03/04/2012   MCV 88.7 03/04/2012   PLT 152 03/04/2012     Assessment/Plan: 1 Day Post-Op   Principal Problem:   Osteoarthritis of right knee   Advance diet Up with therapy Continue PT  Plan for dressing change Thurs Plan for DC on [employer] 03/04/2012, 11:53 AM   Teryl Lucy, MD Cell [phone] Pager [phone]

## 2012-03-04 NOTE — Evaluation (Signed)
Physical Therapy Evaluation Patient Details Name: Todd Howell MRN: [id number] DOB: January 28, 1950 Today's Date: 03/04/2012 Time: 3244-0102 PT Time Calculation (min): 34 min  PT Assessment / Plan / Recommendation Clinical Impression  pt rpesents with R TKA.  pt moving great and anticipate great progress.      PT Assessment  Patient needs continued PT services    Follow Up Recommendations  Home health PT;Supervision - Intermittent    Does the patient have the potential to tolerate intense rehabilitation      Barriers to Discharge None      Equipment Recommendations  Rolling walker with 5" wheels (3-in-1)    Recommendations for Other Services OT consult   Frequency 7X/week    Precautions / Restrictions Precautions Precautions: Fall;Knee Precaution Booklet Issued: Yes (comment) Required Braces or Orthoses: Knee Immobilizer - Right Knee Immobilizer - Right: Discontinue post op day 2 Restrictions Weight Bearing Restrictions: Yes RLE Weight Bearing: Weight bearing as tolerated   Pertinent Vitals/Pain 5/10.  Student RN made aware.        Mobility  Bed Mobility Bed Mobility: Supine to Sit;Sitting - Scoot to Edge of Bed Supine to Sit: 4: Min assist;With rails;HOB elevated Sitting - Scoot to Edge of Bed: 5: Supervision Details for Bed Mobility Assistance: A with R LE only, cues for sequencing.   Transfers Transfers: Sit to Stand;Stand to Sit Sit to Stand: 4: Min guard;With upper extremity assist;From bed Stand to Sit: 4: Min guard;With upper extremity assist;To chair/3-in-1;With armrests Details for Transfer Assistance: cues for UE use, positioning of LEs.   Ambulation/Gait Ambulation/Gait Assistance: 4: Min guard Ambulation Distance (Feet): 200 Feet Assistive device: Rolling walker Ambulation/Gait Assistance Details: cues for gait sequencing, upright posture, positioning in RW.   Gait Pattern: Step-to pattern;Decreased step length - left;Decreased stance time -  right Stairs: No Wheelchair Mobility Wheelchair Mobility: No    Exercises Total Joint Exercises Ankle Circles/Pumps: AROM;Both;10 reps Quad Sets: AROM;Both;10 reps Long Arc Quad: AAROM;Right;10 reps Knee Flexion: AAROM;Right;10 reps   PT Diagnosis: Abnormality of gait;Acute pain  PT Problem List: Decreased strength;Decreased range of motion;Decreased activity tolerance;Decreased balance;Decreased mobility;Decreased knowledge of use of DME PT Treatment Interventions: DME instruction;Gait training;Stair training;Functional mobility training;Therapeutic activities;Therapeutic exercise;Balance training;Patient/family education   PT Goals Acute Rehab PT Goals PT Goal Formulation: With patient Time For Goal Achievement: 03/11/12 Potential to Achieve Goals: Good Pt will go Supine/Side to Sit: with modified independence PT Goal: Supine/Side to Sit - Progress: Goal set today Pt will go Sit to Supine/Side: with modified independence PT Goal: Sit to Supine/Side - Progress: Goal set today Pt will go Sit to Stand: with modified independence PT Goal: Sit to Stand - Progress: Goal set today Pt will go Stand to Sit: with modified independence PT Goal: Stand to Sit - Progress: Goal set today Pt will Ambulate: >150 feet;with modified independence;with rolling walker PT Goal: Ambulate - Progress: Goal set today Pt will Go Up / Down Stairs: 1-2 stairs;with modified independence;with rolling walker PT Goal: Up/Down Stairs - Progress: Goal set today Pt will Perform Home Exercise Program: with supervision, verbal cues required/provided PT Goal: Perform Home Exercise Program - Progress: Goal set today  Visit Information  Last PT Received On: 03/04/12 Assistance Needed: +1    Subjective Data  Subjective: It's not doing too bad.   Patient Stated Goal: Exercise and Traveling.     Prior Functioning  Home Living Lives With: Spouse Available Help at Discharge: Family;Available 24 hours/day Type of  Home: House Home Access: Stairs  to enter Entrance Stairs-Number of Steps: 1, 1 Entrance Stairs-Rails: None Home Layout: One level Bathroom Shower/Tub: Forensic scientist: Handicapped height Home Adaptive Equipment:  (Mother-in-law has RW, but unsure size) Prior Function Level of Independence: Independent Able to Take Stairs?: Yes Driving: Yes Communication Communication: No difficulties    Cognition  Cognition Overall Cognitive Status: Appears within functional limits for tasks assessed/performed Arousal/Alertness: Awake/alert Orientation Level: Appears intact for tasks assessed Behavior During Session: [facility] for tasks performed    Extremity/Trunk Assessment Right Lower Extremity Assessment RLE ROM/Strength/Tone: Deficits RLE ROM/Strength/Tone Deficits: AAROM ~10- 60 RLE Sensation: WFL - Light Touch Left Lower Extremity Assessment LLE ROM/Strength/Tone: WFL for tasks assessed LLE Sensation: WFL - Light Touch Trunk Assessment Trunk Assessment: Normal   Balance Balance Balance Assessed: No  End of Session PT - End of Session Equipment Utilized During Treatment: Gait belt;Right knee immobilizer Activity Tolerance: Patient tolerated treatment well Patient left: in chair;with call bell/phone within reach Nurse Communication: Mobility status  GP     RitenourAlison Murray, [place] [phone] 03/04/2012, 8:42 AM

## 2012-03-04 NOTE — Progress Notes (Signed)
Physical Therapy Note   03/04/12 1200  PT Visit Information  Last PT Received On 03/04/12  Assistance Needed +1  PT Time Calculation  PT Start Time 1123  PT Stop Time 1151  PT Time Calculation (min) 28 min  Subjective Data  Subjective I'm tired, but I want to get this working.    Precautions  Precautions Fall;Knee  Required Braces or Orthoses Knee Immobilizer - Right  Knee Immobilizer - Right Discontinue post op day 2  Restrictions  Weight Bearing Restrictions Yes  RLE Weight Bearing WBAT  Cognition  Overall Cognitive Status Appears within functional limits for tasks assessed/performed  Arousal/Alertness Awake/alert  Orientation Level Appears intact for tasks assessed  Behavior During Session [facility] for tasks performed  Bed Mobility  Bed Mobility Supine to Sit;Sitting - Scoot to Edge of Bed  Supine to Sit 4: Min assist;With rails;HOB elevated  Sitting - Scoot to Edge of Bed 5: Supervision  Details for Bed Mobility Assistance A with R LE only.    Transfers  Transfers Sit to Stand;Stand to Sit  Sit to Stand 4: Min guard;With upper extremity assist;From bed  Stand to Sit 4: Min guard;With upper extremity assist;To chair/3-in-1;With armrests  Details for Transfer Assistance cues for UE use and getting closer to chair prior to sitting.    Ambulation/Gait  Ambulation/Gait Assistance 4: Min guard  Ambulation Distance (Feet) 200 Feet  Assistive device Rolling walker  Ambulation/Gait Assistance Details cues for upright posture, positioning in RW.    Gait Pattern Step-to pattern;Decreased step length - left;Decreased stance time - right  Stairs No  Engineering geologist No  Balance  Balance Assessed No  Exercises  Exercises Total Joint  Total Joint Exercises  Long Arc [place];Right;10 reps  Knee Flexion AAROM;Right;10 reps  Short Arc [employer];Right;10 reps  Hip ABduction/ADduction AAROM;Right;10 reps  PT - End of Session  Equipment Utilized During  Treatment Gait belt;Right knee immobilizer  Activity Tolerance Patient tolerated treatment well  Patient left in chair;with call bell/phone within reach  Nurse Communication Mobility status  PT - Assessment/Plan  Comments on Treatment Session pt presents with R TKA.  pt moving well and making great progress.  Feel pt will be ready for D/C from PT stand point tomorrow.    PT Plan Discharge plan remains appropriate;Frequency remains appropriate  PT Frequency 7X/week  Follow Up Recommendations Home health PT;Supervision - Intermittent  PT equipment Rolling walker with 5" wheels (3-in-1)  Acute Rehab PT Goals  Time For Goal Achievement 03/11/12  Potential to Achieve Goals Good  PT Goal: Supine/Side to Sit - Progress Progressing toward goal  PT Goal: Sit to Stand - Progress Progressing toward goal  PT Goal: Stand to Sit - Progress Progressing toward goal  PT Goal: Ambulate - Progress Progressing toward goal  PT Goal: Perform Home Exercise Program - Progress Progressing toward goal  PT General Charges  $$ ACUTE PT VISIT 1 Procedure  PT Treatments  $Gait Training 8-22 mins  $Therapeutic Exercise 8-22 mins   Elara Cocke, PT [phone]

## 2012-03-04 NOTE — Evaluation (Signed)
Occupational Therapy Evaluation Patient Details Name: Todd Howell MRN: [id number] DOB: [birth year] Today's Date: 03/04/2012 Time: 4098-1191 OT Time Calculation (min): 27 min  OT Assessment / Plan / Recommendation Clinical Impression  Pt demos decline in function with LB ADLs, safety, balance, activity tolerance and strength following R knee surgery. Pt would benefit from OT services to address these impairments to help restore PLOF to return home safely    OT Assessment  Patient needs continued OT Services    Follow Up Recommendations  Home health OT    Barriers to Discharge None    Equipment Recommendations  3 in 1 bedside comode;Tub/shower seat;Other (comment) (ADL A/E kit)    Recommendations for Other Services    Frequency  Min 2X/week    Precautions / Restrictions Precautions Precautions: Fall;Knee Required Braces or Orthoses: Knee Immobilizer - Right Knee Immobilizer - Right: Discontinue post op day 2 Restrictions Weight Bearing Restrictions: Yes RLE Weight Bearing: Weight bearing as tolerated   Pertinent Vitals/Pain     ADL  Grooming: Performed;Wash/dry hands;Wash/dry face;Min guard Where Assessed - Grooming: Supported standing Upper Body Bathing: Simulated;Supervision/safety;Set up Where Assessed - Upper Body Bathing: Unsupported sitting Lower Body Bathing: Simulated;Moderate assistance Where Assessed - Lower Body Bathing: Unsupported sitting;Supported standing Upper Body Dressing: Performed;Set up;Supervision/safety Where Assessed - Upper Body Dressing: Unsupported sitting Lower Body Dressing: Performed;Moderate assistance Where Assessed - Lower Body Dressing: Unsupported sitting;Supported standing Toilet Transfer: Performed;Min guard Toilet Transfer Method: Sit to stand;Other (comment) (ambulating from RW level) Toilet Transfer Equipment: Raised toilet seat with arms (or 3-in-1 over toilet) Toileting - Clothing Manipulation and Hygiene: Performed;Minimal  assistance;Min guard Where Assessed - Toileting Clothing Manipulation and Hygiene: Standing Equipment Used: Long-handled shoe horn;Long-handled sponge;Reacher;Rolling walker;Gait belt;Sock aid (3 in 1) Transfers/Ambulation Related to ADLs: Verbal cues for pacing self and to slow down ADL Comments: pt and wife provided with education and demo of ADL A/E for use at home, educated on use of tub bench for safety at home    OT Diagnosis: Acute pain;Generalized weakness  OT Problem List: Decreased strength;Decreased knowledge of use of DME or AE;Decreased knowledge of precautions;Decreased activity tolerance;Pain;Impaired balance (sitting and/or standing) OT Treatment Interventions: Self-care/ADL training;Balance training;Therapeutic exercise;Neuromuscular education;Therapeutic activities;DME and/or AE instruction;Patient/family education   OT Goals Acute Rehab OT Goals OT Goal Formulation: With patient/family Time For Goal Achievement: 03/11/12 Potential to Achieve Goals: Good ADL Goals Pt Will Perform Grooming: with set-up;with supervision;Standing at sink ADL Goal: Grooming - Progress: Goal set today Pt Will Perform Lower Body Bathing: with min assist;with adaptive equipment ADL Goal: Lower Body Bathing - Progress: Goal set today Pt Will Perform Lower Body Dressing: with min assist;with adaptive equipment ADL Goal: Lower Body Dressing - Progress: Goal set today Pt Will Transfer to Toilet: with supervision;with DME ADL Goal: Toilet Transfer - Progress: Goal set today Pt Will Perform Toileting - Clothing Manipulation: with supervision;Standing;Sitting on 3-in-1 or toilet ADL Goal: Toileting - Clothing Manipulation - Progress: Goal set today Pt Will Perform Tub/Shower Transfer: Tub transfer;with DME ADL Goal: Tub/Shower Transfer - Progress: Goal set today  Visit Information  Last OT Received On: 03/04/12    Subjective Data  Subjective: " I hope to go homw tomorrow " Patient Stated Goal:  To return home   Prior Functioning     Home Living Lives With: Spouse Available Help at Discharge: Family;Available 24 hours/day Type of Home: House Home Access: Stairs to enter [employer] of Steps: 1, 1 Entrance Stairs-Rails: None Home Layout: One level Bathroom Shower/Tub: Tub/shower  unit;Curtain Bathroom Toilet: Handicapped height Home Adaptive Equipment: None Prior Function Level of Independence: Independent Able to Take Stairs?: Yes Driving: Yes Communication Communication: No difficulties Dominant Hand: Right         Vision/Perception Vision - History Baseline Vision: Wears glasses all the time Patient Visual Report: No change from baseline Vision - Assessment Eye Alignment: Within Functional Limits Perception Perception: Within Functional Limits   Cognition  Cognition Overall Cognitive Status: Appears within functional limits for tasks assessed/performed Arousal/Alertness: Awake/alert Orientation Level: Appears intact for tasks assessed Behavior During Session: [name] for tasks performed    Extremity/Trunk Assessment Right Upper Extremity Assessment RUE ROM/Strength/Tone: [facility] for tasks assessed Left Upper Extremity Assessment LUE ROM/Strength/Tone: [facility] for tasks assessed     Mobility Bed Mobility Bed Mobility: Scooting to HOB;Sit to Supine Sit to Supine: 4: Min assist;Other (comment) (min A with R LE) Scooting to HOB: 5: Supervision Transfers Transfers: Sit to Stand;Stand to Sit Sit to Stand: 4: Min guard;Without upper extremity assist;With armrests;From chair/3-in-1;From toilet Stand to Sit: Without upper extremity assist;4: Min guard;With armrests;To chair/3-in-1;To toilet Details for Transfer Assistance: cues for decreasing speed, correct hand placement and LE position     Exercise     Balance Balance Balance Assessed: No   End of Session OT - End of Session Equipment Utilized During Treatment: Gait belt;Right knee immobilizer (3  in 1, RW, ADL A/E) Activity Tolerance: Patient tolerated treatment well Patient left: in bed;with call bell/phone within reach;with family/visitor present  GO     Galen Manila 03/04/2012, 12:02 PM

## 2012-03-04 NOTE — Progress Notes (Signed)
CARE MANAGEMENT NOTE 03/04/2012  Patient:  Todd Howell, Todd Howell   Account Number:  [id number]  Date Initiated:  03/04/2012  Documentation initiated by:  Vance Peper  Subjective/Objective Assessment:   62 yr old male s/p right total knee arthroplasty     Action/Plan:   CM spoke with patient and wife concerning home health and DME needs at discharge. Patient has rolling walker and 3in1. Preoperatively setup with [facility], no changes.Family support at discharge.   Anticipated DC Date:  03/05/2012   Anticipated DC Plan:  HOME W HOME HEALTH SERVICES      DC Planning Services  CM consult      [facility] Choice  HOME HEALTH   Choice offered to / List presented to:  C-1 Patient        HH arranged  HH-2 PT      [facility] agency  [facility]   Status of service:  Completed, signed off Medicare Important Message given?   (If response is "NO", the following Medicare IM given date fields will be blank) Date Medicare IM given:   Date Additional Medicare IM given:    Discharge Disposition:  HOME W HOME HEALTH SERVICES  Per UR Regulation:    If discussed at Long Length of Stay Meetings, dates discussed:    Comments:

## 2012-03-04 NOTE — Plan of Care (Signed)
Problem: Phase II Progression Outcomes Goal: Discharge plan established Recommend HH OT for ADL trg and ADL mobility safety trg, depending on acute care progress after d/c. Recommend tub bench, 3 in 1 and ADL A/E

## 2012-03-04 NOTE — Progress Notes (Signed)
UR COMPLETED  

## 2012-03-04 NOTE — Consult Note (Signed)
ANTICOAGULATION CONSULT NOTE - Follow up Consult  Pharmacy Consult for Coumadin Indication: VTE prophylaxis s/p R-TKA  Allergies: No Known Allergies  Height/Weight: Dosing weight 88.2 kg  Vital Signs: BP 146/88  Pulse 64  Temp(Src) 97.9 F (36.6 C) (Oral)  Resp 18  SpO2 100%  Labs:  Recent Labs  03/03/12 1809 03/04/12 0620  HGB 14.3 12.1*  HCT 40.0 34.7*  PLT 179 152  LABPROT  --  13.5  INR  --  1.04  CREATININE 0.73 0.83   Lab Results  Component Value Date   INR 0.98 02/27/2012   Medical / Surgical History: Past Medical History  Diagnosis Date  . Bladder cancer     Dr. Annabell Howells, [facility]  . Hyperlipidemia   . Seizures   . Asthma     mainly as a child  . Hypertension     sees  dr.  Sharyn Lull  . High cholesterol   . Osteoarthritis of right knee 03/03/2012   Past Surgical History  Procedure Laterality Date  . Rotator cuff repair  07/2009    right  . Bladder surgery  02/2005    TURBT   . Anal fissure repair  09/20/2011    Lat sphincterotomy  . Hemorrhoid surgery  09/20/2011    Int hemorrhoidectomy x 2  . Total knee arthroplasty Right 03/03/2012    Dr Dion Saucier    Medications:  Prescriptions prior to admission  Medication Sig Dispense Refill  . CRESTOR 20 MG tablet Take 20 mg by mouth at bedtime.       . divalproex (DEPAKOTE ER) 500 MG 24 hr tablet Take 1,000 mg by mouth at bedtime.       Marland Kitchen MICARDIS 40 MG tablet Take 40 mg by mouth every morning.        Scheduled:  . [COMPLETED] acetaminophen  1,000 mg Intravenous Once  . acetaminophen  1,000 mg Intravenous Q6H  . atorvastatin  40 mg Oral q1800  . [COMPLETED]  ceFAZolin (ANCEF) IV  2 g Intravenous On Call to OR  . [COMPLETED]  ceFAZolin (ANCEF) IV  2 g Intravenous Q6H  . coumadin book  1 each Does not apply Once  . divalproex  1,000 mg Oral QHS  . docusate sodium  100 mg Oral BID  . enoxaparin (LOVENOX) injection  30 mg Subcutaneous Q12H  . [EXPIRED] HYDROmorphone      . [EXPIRED] HYDROmorphone       . irbesartan  150 mg Oral Daily  . ketorolac  7.5 mg Intravenous Q6H  . [EXPIRED] methocarbamol      . [EXPIRED] oxyCODONE      . senna  1 tablet Oral BID  . [COMPLETED] warfarin  7.5 mg Oral ONCE-1800  . warfarin  1 each Does not apply Once  . Warfarin - Pharmacist Dosing Inpatient   Does not apply q1800  . [DISCONTINUED] acetaminophen  15 mg/kg Intravenous Q6H    Assessment:  61 y.o.male with Osteoarthritis of right knee s/p R-TKA receiving coumadin for dvt prophylaxis. Patient receiving lovenox 30mg  q12h as well until INR therapeutic.   Goal of Therapy:   INR 2-3      Plan:   Repeat Coumadin 7.5 mg tonight.  Continue Lovenox 30 mg sq q 12 hours  Verlene Mayer, PharmD, [place] Pager [phone] 03/04/2012,  11:03 AM

## 2012-03-05 LAB — CBC
Hemoglobin: 11.3 g/dL — ABNORMAL LOW (ref 13.0–17.0)
MCH: 31 pg (ref 26.0–34.0)
MCV: 89.6 fL (ref 78.0–100.0)
Platelets: 140 10*3/uL — ABNORMAL LOW (ref 150–400)
RBC: 3.65 MIL/uL — ABNORMAL LOW (ref 4.22–5.81)
WBC: 14 10*3/uL — ABNORMAL HIGH (ref 4.0–10.5)

## 2012-03-05 MED ORDER — WARFARIN SODIUM 5 MG PO TABS
5.0000 mg | ORAL_TABLET | Freq: Once | ORAL | Status: AC
  Administered 2012-03-05: 5 mg via ORAL
  Filled 2012-03-05 (×2): qty 1

## 2012-03-05 NOTE — Progress Notes (Signed)
Physical Therapy Treatment Patient Details Name: Todd Howell MRN: [id number] DOB: 05-Dec-1950 Today's Date: 03/05/2012 Time: 0931-1006 PT Time Calculation (min): 35 min  PT Assessment / Plan / Recommendation Comments on Treatment Session  pt rpesents with R TKA.  pt moving slower today due to increased pain and edema.  pt left with ice on knee.      Follow Up Recommendations  Home health PT;Supervision - Intermittent     Does the patient have the potential to tolerate intense rehabilitation     Barriers to Discharge        Equipment Recommendations  Rolling walker with 5" wheels (3-in-1)    Recommendations for Other Services    Frequency 7X/week   Plan Discharge plan remains appropriate;Frequency remains appropriate    Precautions / Restrictions Precautions Precautions: Fall;Knee Required Braces or Orthoses: Knee Immobilizer - Right Knee Immobilizer - Right: Discontinue post op day 2 Restrictions Weight Bearing Restrictions: Yes RLE Weight Bearing: Weight bearing as tolerated   Pertinent Vitals/Pain Pt indicates pain 7/10.  Premedicated.      Mobility  Bed Mobility Bed Mobility: Supine to Sit;Sitting - Scoot to Edge of Bed Supine to Sit: 4: Min assist;With rails Sitting - Scoot to Edge of Bed: 5: Supervision Details for Bed Mobility Assistance: A with R LE only.   Transfers Transfers: Sit to Stand;Stand to Sit Sit to Stand: 4: Min guard;With upper extremity assist;From bed Stand to Sit: 4: Min guard;With upper extremity assist;To chair/3-in-1 Details for Transfer Assistance: cues for UE use, controlling descent to chair.   Ambulation/Gait Ambulation/Gait Assistance: 4: Min guard Ambulation Distance (Feet): 110 Feet Assistive device: Rolling walker Ambulation/Gait Assistance Details: pt with increased pain and swelling today.  Cues for increased heel strike, upright posture.   Gait Pattern: Step-to pattern;Decreased step length - left;Decreased stance time -  right Stairs: Yes Stairs Assistance: 4: Min assist Stairs Assistance Details (indicate cue type and reason): cues fro safe technique with RW.   Stair Management Technique: No rails;Backwards;With walker Number of Stairs: 1 (x4) Wheelchair Mobility Wheelchair Mobility: No    Exercises Total Joint Exercises Ankle Circles/Pumps: AROM;Both;10 reps Quad Sets: AROM;Both;10 reps Heel Slides: AAROM;Right;10 reps Hip ABduction/ADduction: AAROM;Right;10 reps   PT Diagnosis:    PT Problem List:   PT Treatment Interventions:     PT Goals Acute Rehab PT Goals Time For Goal Achievement: 03/11/12 Potential to Achieve Goals: Good PT Goal: Supine/Side to Sit - Progress: Progressing toward goal PT Goal: Sit to Stand - Progress: Progressing toward goal PT Goal: Stand to Sit - Progress: Progressing toward goal PT Goal: Ambulate - Progress: Progressing toward goal PT Goal: Up/Down Stairs - Progress: Progressing toward goal PT Goal: Perform Home Exercise Program - Progress: Progressing toward goal  Visit Information  Last PT Received On: 03/05/12 Assistance Needed: +1    Subjective Data  Subjective: It hurts more today.     Cognition  Cognition Overall Cognitive Status: Appears within functional limits for tasks assessed/performed Arousal/Alertness: Awake/alert Orientation Level: Appears intact for tasks assessed Behavior During Session: [facility] for tasks performed    Balance  Balance Balance Assessed: No  End of Session PT - End of Session Equipment Utilized During Treatment: Gait belt Activity Tolerance: Patient limited by pain Patient left: in chair;with call bell/phone within reach Nurse Communication: Mobility status   GP     [name], [place] [phone] 03/05/2012, 2:26 PM

## 2012-03-05 NOTE — Progress Notes (Signed)
Occupational Therapy Treatment Patient Details Name: Todd Howell MRN: [id number] DOB: Feb 02, 1950 Today's Date: 03/05/2012 Time: 1359-1440 OT Time Calculation (min): 41 min  OT Assessment / Plan / Recommendation Comments on Treatment Session Pt making progress and doing well. Pt should continue with OT services to maximize level of function to return home safely    Follow Up Recommendations  Home health OT    Barriers to Discharge   None    Equipment Recommendations  3 in 1 bedside comode;Tub/shower seat;Other (comment)    Recommendations for Other Services    Frequency Min 2X/week   Plan Discharge plan remains appropriate    Precautions / Restrictions Precautions Precautions: Fall;Knee Precaution Booklet Issued: Yes (comment) Required Braces or Orthoses: Knee Immobilizer - Right Knee Immobilizer - Right: Discontinue post op day 2 Restrictions Weight Bearing Restrictions: Yes RLE Weight Bearing: Weight bearing as tolerated   Pertinent Vitals/Pain     ADL  Grooming: Performed;Wash/dry hands;Wash/dry face;Supervision/safety;Min guard Where Assessed - Grooming: Supported standing Lower Body Bathing: Simulated;Minimal assistance;Moderate assistance Where Assessed - Lower Body Bathing: Supported sitting Toilet Transfer: Performed;Supervision/safety;Min Pension scheme manager Method: Sit to Barista: Raised toilet seat with arms (or 3-in-1 over toilet) Tub/Shower Transfer: Performed;Minimal assistance Tub/Shower Transfer Method: Science writer: Counsellor Used: Long-handled sponge;Rolling walker;Gait belt Transfers/Ambulation Related to ADLs: Pt required verbal cues for correct hand placement when using tub bench for transfer ADL Comments: Pt required increased time with ADL/functional mobility due to reports of pain/stiffness in R LE. Pt 's wife educated on tub bench transfer and assisting pt with R LE and  was able to return demo    OT Diagnosis:    OT Problem List:   OT Treatment Interventions:     OT Goals ADL Goals ADL Goal: Grooming - Progress: Progressing toward goals ADL Goal: Lower Body Bathing - Progress: Progressing toward goals ADL Goal: Toilet Transfer - Progress: Progressing toward goals ADL Goal: Tub/Shower Transfer - Progress: Progressing toward goals  Visit Information  Last OT Received On: 03/05/12 Assistance Needed: +1 PT/OT Co-Evaluation/Treatment: Yes    Subjective Data  Subjective: " [name] " Patient Stated Goal: To return home   Prior Functioning       Cognition  Cognition Overall Cognitive Status: Appears within functional limits for tasks assessed/performed Arousal/Alertness: Awake/alert Orientation Level: Appears intact for tasks assessed;Oriented X4 / Intact Behavior During Session: [name] for tasks performed    Mobility  Bed Mobility Bed Mobility: Supine to Sit;Sitting - Scoot to Edge of Bed;Sit to Supine Supine to Sit: 4: Min assist Sitting - Scoot to Edge of Bed: 5: Supervision Sit to Supine: 4: Min assist Scooting to [facility]: 5: Set up;With trapeze Details for Bed Mobility Assistance: A with R LE only.   Transfers Transfers: Sit to Stand;Stand to Sit Sit to Stand: 4: Min guard;With upper extremity assist;From bed Stand to Sit: 4: Min guard;To bed Details for Transfer Assistance: cues for handplacement and safety     Exercises  Total Joint Exercises Ankle Circles/Pumps: 10 reps;AROM;Right   Balance Balance Balance Assessed: No   End of Session OT - End of Session Equipment Utilized During Treatment: Gait belt;Right knee immobilizer (RW, tub transfer bench) Activity Tolerance: Patient tolerated treatment well Patient left: in bed;with call bell/phone within reach;with family/visitor present  GO     Galen Manila 03/05/2012, 3:00 PM

## 2012-03-05 NOTE — Progress Notes (Signed)
Physical Therapy Note   03/05/12 1400  PT Visit Information  Last PT Received On 03/05/12  Assistance Needed +1  PT Time Calculation  PT Start Time 1359  PT Stop Time 1444  PT Time Calculation (min) 45 min  Subjective Data  Subjective Feels better this afternoon. 5/10 pain.  Precautions  Precautions Fall;Knee  Precaution Booklet Issued Yes (comment)  Required Braces or Orthoses Knee Immobilizer - Right  Knee Immobilizer - Right Discontinue post op day 2  Restrictions  Weight Bearing Restrictions Yes  RLE Weight Bearing WBAT  Cognition  Overall Cognitive Status Appears within functional limits for tasks assessed/performed  Arousal/Alertness Awake/alert  Orientation Level Appears intact for tasks assessed  Behavior During Session [facility] for tasks performed  Bed Mobility  Bed Mobility Supine to Sit;Sitting - Scoot to [place] of Bed;Sit to Supine  Supine to Sit 4: Min assist  Sitting - Scoot to Edge of Bed 5: Supervision  Sit to Supine 4: Min assist  Scooting to [facility] 5: Set up;With trapeze  Details for Bed Mobility Assistance A with R LE only.    Transfers  Transfers Sit to Stand;Stand to Sit  Sit to Stand 4: Min guard;With upper extremity assist;From bed  Stand to Sit 4: Min guard;To bed  Details for Transfer Assistance cues for handplacement and safety   Ambulation/Gait  Ambulation/Gait Assistance 4: Min guard  Ambulation Distance (Feet) 110 Feet (x2)  Assistive device Rolling walker  Ambulation/Gait Assistance Details Cues for gait pattern, safety with RW, heel strike on R LE and upright posture.   Gait Pattern Step-to pattern;Decreased step length - left;Decreased stance time - right  Stairs Yes  Stairs Assistance 4: Min guard  Stairs Assistance Details (indicate cue type and reason) cues for safety   Stair Management Technique No rails;Backwards;With walker  Number of Stairs 1  Wheelchair Mobility  Wheelchair Mobility No  Balance  Balance Assessed No  Exercises   Exercises Total Joint  Total Joint Exercises  Ankle Circles/Pumps 10 reps;AROM;Right  PT - End of Session  Equipment Utilized During Treatment Gait belt  Activity Tolerance Patient tolerated treatment well  Patient left in bed;with call bell/phone within reach;with family/visitor present  Nurse Communication Mobility status;Patient requests pain meds  PT - Assessment/Plan  Comments on Treatment Session Pt presents with R TKA. Pt moving better this afternoon with decreased pain. Pt left with pillow under ankle and  ice packs on knee to reduce edema and pain.   PT Plan Discharge plan remains appropriate;Frequency remains appropriate  PT Frequency 7X/week  Follow Up Recommendations Home health PT;Supervision - Intermittent  PT equipment Rolling walker with 5" wheels (3-in-1, tub bench)  Acute Rehab PT Goals  Time For Goal Achievement 03/11/12  Potential to Achieve Goals Good  PT Goal: Supine/Side to Sit - Progress Progressing toward goal  PT Goal: Sit to Supine/Side - Progress Progressing toward goal  PT Goal: Sit to Stand - Progress Progressing toward goal  PT Goal: Stand to Sit - Progress Progressing toward goal  PT Goal: Ambulate - Progress Progressing toward goal  PT Goal: Up/Down Stairs - Progress Progressing toward goal  Pt will Perform Home Exercise Program with supervision, verbal cues required/provided  PT Goal: Perform Home Exercise Program - Progress Progressing toward goal  PT General Charges  $$ ACUTE PT VISIT 1 Procedure  PT Treatments  $Gait Training 23-37 mins  $Therapeutic Activity 8-22 mins   [place], PT  [place], [place] [phone]

## 2012-03-05 NOTE — Consult Note (Signed)
ANTICOAGULATION CONSULT NOTE - Follow up Consult  Pharmacy Consult for Coumadin Indication: VTE prophylaxis s/p R-TKA  Allergies: No Known Allergies  Height/Weight: Dosing weight 88.2 kg  Vital Signs: BP 121/72  Pulse 94  Temp(Src) 98.9 F (37.2 C) (Oral)  Resp 18  SpO2 96%  Labs:  Recent Labs  03/03/12 1809 03/04/12 0620 03/05/12 0632  HGB 14.3 12.1* 11.3*  HCT 40.0 34.7* 32.7*  PLT 179 152 140*  LABPROT  --  13.5 19.5*  INR  --  1.04 1.71*  CREATININE 0.73 0.83  --      Assessment:  62 y.o.male with Osteoarthritis of right knee s/p R-TKA receiving coumadin for dvt prophylaxis. Patient receiving lovenox 30mg  q12h as well until INR therapeutic.   INR 1.71 after 2 doses of coumadin 7.5 mg. H/H down post-op 2nd ABLA. No bleeding reported. Large 6 second jump in protime.  Coumadin education completed 2/25 by CB.  Goal of Therapy:   INR 2-3      Plan:   Coumadin 5 mg po x 1 dose tonight  Continue Lovenox 30 mg sq q 12 hours  Daily INR   Herby Abraham, Pharm.D. [phone] 03/05/2012 2:24 PM

## 2012-03-05 NOTE — Progress Notes (Signed)
Patient ID: Todd Howell, male   DOB: 17-Jun-1950, 62 y.o.   MRN: [id number]     Subjective:  Patient reports pain as mild to moderate.  More pain last night but well controlled so far today.  Denies CP or SOB  Objective:   VITALS:   Filed Vitals:   03/04/12 2302 03/05/12 0000 03/05/12 0400 03/05/12 0703  BP: 120/74   124/72  Pulse: 102   94  Temp: 98.4 F (36.9 C)   98.9 F (37.2 C)  TempSrc: Oral   Oral  Resp: 16 16 17 17   SpO2: 92%   96%    ABD soft Sensation intact distally Dorsiflexion/Plantar flexion intact Incision: dressing C/D/I and no drainage   Lab Results  Component Value Date   WBC 10.3 03/04/2012   HGB 12.1* 03/04/2012   HCT 34.7* 03/04/2012   MCV 88.7 03/04/2012   PLT 152 03/04/2012     Assessment/Plan: 2 Days Post-Op   Principal Problem:   Osteoarthritis of right knee   Advance diet Up with therapy Plan for discharge tomorrow WBAT Plan Dressing change tomorrow before DC   Haskel Khan 03/05/2012, 7:39 AM   Teryl Lucy, MD Cell [phone] Pager [phone]

## 2012-03-06 LAB — CBC
HCT: 27.9 % — ABNORMAL LOW (ref 39.0–52.0)
MCV: 88.9 fL (ref 78.0–100.0)
Platelets: 132 10*3/uL — ABNORMAL LOW (ref 150–400)
RBC: 3.14 MIL/uL — ABNORMAL LOW (ref 4.22–5.81)
RDW: 12.9 % (ref 11.5–15.5)
WBC: 16 10*3/uL — ABNORMAL HIGH (ref 4.0–10.5)

## 2012-03-06 LAB — PROTIME-INR: INR: 2.8 — ABNORMAL HIGH (ref 0.00–1.49)

## 2012-03-06 MED ORDER — SODIUM CHLORIDE 0.9 % IV BOLUS (SEPSIS)
500.0000 mL | Freq: Once | INTRAVENOUS | Status: AC
  Administered 2012-03-06: 500 mL via INTRAVENOUS

## 2012-03-06 NOTE — Progress Notes (Signed)
     Subjective:  Patient reports pain as moderate.  Had some lightheadedness, and has also developed blistering on his leg. He also had an episode of hypotension, with blood pressure systolic in the 70s.  Objective:   VITALS:   Filed Vitals:   03/05/12 2057 03/06/12 0140 03/06/12 0400 03/06/12 0628  BP: 148/118 123/77  144/70  Pulse: 107   94  Temp: 98.6 F (37 C)   98.5 F (36.9 C)  TempSrc: Oral   Oral  Resp: 18  18 18   SpO2: 96%  96% 96%    Neurologically intact Sensation intact distally Dorsiflexion/Plantar flexion intact His dressing is intact, but he has fairly significant sized  blisters both on the medial and lateral aspect of the leg. The lateral side has popped, but the medial side has not. There is no purulence, but there is serous fluid within the blisters.  Lab Results  Component Value Date   WBC 16.0* 03/06/2012   HGB 9.8* 03/06/2012   HCT 27.9* 03/06/2012   MCV 88.9 03/06/2012   PLT 132* 03/06/2012     Assessment/Plan: 3 Days Post-Op   Principal Problem:   Osteoarthritis of right knee  Given his hypotension, we're going to give him a 500 cc bolus of normal saline. He has some mild acute blood loss anemia, but not enough to warrant transfusion. His white count is elevated slightly, and we're to monitor his temperature curve. If he maintains his blood pressure, and his wounds are clean, and he is able to ambulate independently, then he may be discharged tomorrow.    Katura Eatherly P 03/06/2012, 12:59 PM   Teryl Lucy, MD Cell [phone] Pager [phone]

## 2012-03-06 NOTE — Consult Note (Signed)
ANTICOAGULATION CONSULT NOTE - Follow up Consult  Pharmacy Consult for Coumadin Indication: VTE prophylaxis s/p R-TKA  Allergies: No Known Allergies  Height/Weight: Dosing weight 88.2 kg  Vital Signs: BP 144/70  Pulse 94  Temp(Src) 98.5 F (36.9 C) (Oral)  Resp 18  SpO2 96%  Labs:  Recent Labs  03/03/12 1809 03/04/12 0620 03/05/12 0632 03/06/12 0650  HGB 14.3 12.1* 11.3* 9.8*  HCT 40.0 34.7* 32.7* 27.9*  PLT 179 152 140* 132*  LABPROT  --  13.5 19.5* 28.1*  INR  --  1.04 1.71* 2.80*  CREATININE 0.73 0.83  --   --      Assessment:  61 y.o.male with Osteoarthritis of right knee s/p R-TKA receiving coumadin for dvt prophylaxis.  INR up to 2.8 after 3 doses of coumadin. Large 8.5 sec jump in protime.   H/H down post-op 2nd ABLA. No bleeding reported.  Coumadin education completed 2/25 by CB.  Goal of Therapy:   INR 2-3      Plan:   No coumadin today, then rec DC home on 5 mg po daily to start Friday 2/28 with outpt INR f/u  Stop Lovenox 30 mg sq q 12 hours as INR > 1.8   Herby Abraham, Pharm.D. [phone] 03/06/2012 10:16 AM

## 2012-03-06 NOTE — Progress Notes (Signed)
Physical Therapy Treatment Patient Details Name: Todd Howell MRN: [id number] DOB: 1950/06/18 Today's Date: 03/06/2012 Time: 4696-2952 PT Time Calculation (min): 20 min  PT Assessment / Plan / Recommendation Comments on Treatment Session  Pt presents with R TKA. Activity and mobility limited by BP. Pt supine BP 86/61 before bed exercises, 89/44 post bed exercises. Pt cleared by PT to d/c home when BP regulated.     Follow Up Recommendations  Home health PT;Supervision - Intermittent     Does the patient have the potential to tolerate intense rehabilitation     Barriers to Discharge        Equipment Recommendations  Rolling walker with 5" wheels (3-in-1)    Recommendations for Other Services    Frequency 7X/week   Plan Discharge plan remains appropriate;Frequency remains appropriate    Precautions / Restrictions Precautions Precautions: Fall;Knee Restrictions Weight Bearing Restrictions: Yes RLE Weight Bearing: Weight bearing as tolerated   Pertinent Vitals/Pain 5/10.      Mobility  Bed Mobility Bed Mobility: Not assessed Transfers Transfers: Not assessed Ambulation/Gait Ambulation/Gait Assistance: Not tested (comment) Stairs: No Wheelchair Mobility Wheelchair Mobility: No    Exercises Total Joint Exercises Ankle Circles/Pumps: 10 reps;Right;AROM;Supine Quad Sets: AROM;Right;10 reps;Supine Heel Slides: AAROM;Right;15 reps;Supine (cues for proper breathing ) Hip ABduction/ADduction: AAROM;Right;10 reps Straight Leg Raises: 5 reps;AAROM;Supine   PT Diagnosis:    PT Problem List:   PT Treatment Interventions:     PT Goals Acute Rehab PT Goals PT Goal Formulation: With patient Time For Goal Achievement: 03/11/12 Potential to Achieve Goals: Good Pt will Perform Home Exercise Program: with supervision, verbal cues required/provided PT Goal: Perform Home Exercise Program - Progress: Progressing toward goal  Visit Information  Last PT Received On:  03/06/12 Assistance Needed: +1    Subjective Data  Subjective: Pt sleepy this afternoon.    Cognition  Cognition Overall Cognitive Status: Appears within functional limits for tasks assessed/performed Arousal/Alertness: Awake/alert Orientation Level: Appears intact for tasks assessed;Oriented X4 / Intact Behavior During Session: [facility] for tasks performed    Balance  Balance Balance Assessed: No  End of Session PT - End of Session Activity Tolerance: Patient limited by pain Patient left: in bed;with call bell/phone within reach;with family/visitor present Nurse Communication: Mobility status   GP    Donnamarie Poag, PT Kizzie Ide [place], [place] [phone] 03/06/2012, 2:27 PM

## 2012-03-06 NOTE — Progress Notes (Signed)
Physical Therapy Treatment Patient Details Name: Todd Howell MRN: [id number] DOB: May 15, 1950 Today's Date: 03/06/2012 Time: 4098-1191 PT Time Calculation (min): 29 min  PT Assessment / Plan / Recommendation Comments on Treatment Session  Pt presents with R TKA. Pt moving better this morning. Pt Left with towel roll under R LE to faciliate R knee ext and ice packs on R knee to decrease pain and edema. Pt ready for D/C from PT stand point at this time.     Follow Up Recommendations  Home health PT;Supervision - Intermittent     Does the patient have the potential to tolerate intense rehabilitation     Barriers to Discharge        Equipment Recommendations  Rolling walker with 5" wheels    Recommendations for Other Services    Frequency 7X/week   Plan Discharge plan remains appropriate;Frequency remains appropriate    Precautions / Restrictions Precautions Precautions: Fall;Knee Restrictions Weight Bearing Restrictions: Yes RLE Weight Bearing: Weight bearing as tolerated   Pertinent Vitals/Pain 6/10 pain, premedicated.     Mobility  Bed Mobility Bed Mobility: Supine to Sit;Sitting - Scoot to Edge of Bed Supine to Sit: 4: Min assist Sitting - Scoot to Edge of Bed: 5: Supervision Details for Bed Mobility Assistance: A with R LE only.   Transfers Transfers: Sit to Stand;Stand to Sit Sit to Stand: 4: Min guard;With upper extremity assist;From bed Stand to Sit: 4: Min guard;To chair/3-in-1 Details for Transfer Assistance: cues for handplacement and safety  Ambulation/Gait Ambulation/Gait Assistance: 4: Min guard Ambulation Distance (Feet): 200 Feet Assistive device: Rolling walker Ambulation/Gait Assistance Details: cues for gait sequencing, to increase step length on L LE and increase R heel strike.  Gait Pattern: Step-to pattern;Decreased stance time - right;Decreased step length - left;Right flexed knee in stance Stairs: No Wheelchair Mobility Wheelchair Mobility:  No    Exercises     PT Diagnosis:    PT Problem List:   PT Treatment Interventions:     PT Goals Acute Rehab PT Goals PT Goal Formulation: With patient Time For Goal Achievement: 03/11/12 Potential to Achieve Goals: Good Pt will go Supine/Side to Sit: with modified independence PT Goal: Supine/Side to Sit - Progress: Progressing toward goal Pt will go Sit to Stand: with modified independence PT Goal: Sit to Stand - Progress: Progressing toward goal Pt will go Stand to Sit: with modified independence PT Goal: Stand to Sit - Progress: Progressing toward goal Pt will Ambulate: >150 feet;with modified independence;with rolling walker PT Goal: Ambulate - Progress: Progressing toward goal  Visit Information  Last PT Received On: 03/06/12 Assistance Needed: +1    Subjective Data  Subjective: Feels better today. 6/10 pain in R knee.    Cognition  Cognition Overall Cognitive Status: Appears within functional limits for tasks assessed/performed Arousal/Alertness: Awake/alert Orientation Level: Appears intact for tasks assessed;Oriented X4 / Intact Behavior During Session: [facility] for tasks performed    Balance  Balance Balance Assessed: No  End of Session PT - End of Session Equipment Utilized During Treatment: Gait belt Activity Tolerance: Patient tolerated treatment well Patient left: in chair;with call bell/phone within reach (ice packs placed on R LE, towel roll placed under R ankle ) Nurse Communication: Mobility status;Patient requests pain meds   GP    Donnamarie Poag, PT [name], [place] [phone] 03/06/2012, 9:30 AM

## 2012-03-07 DIAGNOSIS — S8010XA Contusion of unspecified lower leg, initial encounter: Secondary | ICD-10-CM

## 2012-03-07 LAB — PROTIME-INR: INR: 2.82 — ABNORMAL HIGH (ref 0.00–1.49)

## 2012-03-07 MED ORDER — CEFAZOLIN SODIUM 1-5 GM-% IV SOLN
1.0000 g | Freq: Three times a day (TID) | INTRAVENOUS | Status: AC
  Administered 2012-03-07 – 2012-03-10 (×7): 1 g via INTRAVENOUS
  Filled 2012-03-07 (×10): qty 50

## 2012-03-07 MED ORDER — ASPIRIN EC 325 MG PO TBEC: 325.0000 mg | DELAYED_RELEASE_TABLET | Freq: Every day | ORAL | Status: DC

## 2012-03-07 MED ORDER — WARFARIN SODIUM 4 MG PO TABS
4.0000 mg | ORAL_TABLET | Freq: Once | ORAL | Status: DC
  Filled 2012-03-07: qty 1

## 2012-03-07 NOTE — Progress Notes (Signed)
Occupational Therapy Treatment Patient Details Name: GAMAL TODISCO MRN: [id number] DOB: [birth year] Today's Date: 03/07/2012 Time: 4098-1191 OT Time Calculation (min): 33 min  OT Assessment / Plan / Recommendation Comments on Treatment Session      Follow Up Recommendations  Home health OT    Barriers to Discharge       Equipment Recommendations  Other (comment) (equipment has been delivered to the room)    Recommendations for Other Services    Frequency Min 2X/week   Plan Discharge plan remains appropriate    Precautions / Restrictions Precautions Precautions: Fall;Knee Precaution Booklet Issued: Yes (comment) Restrictions Weight Bearing Restrictions: Yes RLE Weight Bearing: Weight bearing as tolerated   Pertinent Vitals/Pain 6-7/10 per pt.    ADL  Tub/Shower Transfer: Performed;Moderate assistance Tub/Shower Transfer Method: Science writer: Transfer tub bench Transfers/Ambulation Related to ADLs: verbal/inst. cues for pt. and spouse for hand placement and assisting RLE over the tub ledge    OT Diagnosis:    OT Problem List:   OT Treatment Interventions:     OT Goals ADL Goals ADL Goal: Tub/Shower Transfer - Progress: Progressing toward goals  Visit Information  Last OT Received On: 03/07/12 Assistance Needed: +1    Subjective Data  Subjective: "i think im going to need to stay another day"   Prior Functioning       Cognition  Cognition Overall Cognitive Status: Appears within functional limits for tasks assessed/performed Arousal/Alertness: Awake/alert Orientation Level: Appears intact for tasks assessed Behavior During Session: [facility] for tasks performed    Mobility  Bed Mobility Bed Mobility: Supine to Sit;Sitting - Scoot to [place] of Bed;Sit to Supine;Scooting to [facility] Supine to Sit: 4: Min assist;HOB flat Sitting - Scoot to Edge of Bed: 4: Min assist Sit to Supine: 4: Min assist;HOB flat Scooting to [facility]: 5:  Supervision Details for Bed Mobility Assistance: a needed for R LE  Transfers Transfers: Sit to Stand;Stand to Sit Sit to Stand: 4: Min guard Stand to Sit: 4: Min guard;Without upper extremity assist;To chair/3-in-1 Details for Transfer Assistance: cues for handplacement and safety  (standing BP 111/58.)    Exercises  Total Joint Exercises Ankle Circles/Pumps: AROM;10 reps Quad Sets: AROM;10 reps Heel Slides: AAROM;10 reps Hip ABduction/ADduction: AAROM;10 reps Straight Leg Raises: AAROM;10 reps   Balance Balance Balance Assessed: Yes Static Standing Balance Static Standing - Balance Support: Bilateral upper extremity supported Static Standing - Level of Assistance: 4: Min assist   End of Session OT - End of Session Activity Tolerance: Patient tolerated treatment well Patient left: in bed;with call bell/phone within reach;with family/visitor present  GO     Robet Leu 03/07/2012, 11:40 AM

## 2012-03-07 NOTE — Progress Notes (Signed)
Physical Therapy Treatment Patient Details Name: Todd Howell MRN: [id number] DOB: [birth year] Today's Date: 03/07/2012 Time: 4098-1191 PT Time Calculation (min): 47 min  PT Assessment / Plan / Recommendation Comments on Treatment Session  Pt presents with R TKA. Pt able to increase participation today with BP 94/58 in sitting, 111/58 in standing and 101/49 post exercise/walking. Pt had drainage from R LE blisters, nursing notified. Pt is cleared to D/C from PT standpoint at this time.     Follow Up Recommendations  Home health PT;Supervision - Intermittent     Does the patient have the potential to tolerate intense rehabilitation     Barriers to Discharge        Equipment Recommendations  Rolling walker with 5" wheels (3-in1)    Recommendations for Other Services    Frequency 7X/week   Plan Discharge plan remains appropriate;Frequency remains appropriate    Precautions / Restrictions Precautions Precautions: Fall;Knee Restrictions Weight Bearing Restrictions: Yes RLE Weight Bearing: Weight bearing as tolerated   Pertinent Vitals/Pain 4/10.  Pt premedicated.      Mobility  Bed Mobility Bed Mobility: Supine to Sit;Sitting - Scoot to Edge of Bed Supine to Sit: 4: Min assist Sitting - Scoot to Edge of Bed: 5: Supervision (Sitting EOB BP 94/58) Details for Bed Mobility Assistance: A with R LE only.   (cues for hand placement . BP in supine 87/64.  ) Transfers Transfers: Sit to Stand;Stand to Sit Sit to Stand: 4: Min guard Stand to Sit: 4: Min guard;With upper extremity assist;To chair/3-in-1 Details for Transfer Assistance: cues for handplacement and safety  (standing BP 111/58.) Ambulation/Gait Ambulation/Gait Assistance: 4: Min guard Ambulation Distance (Feet): 250 Feet Assistive device: Rolling walker Ambulation/Gait Assistance Details: cues for gait sequencing, upright posture, proper heel strike on R LE, decrease stride length on R LE and increase stride length on L  LE.  Gait Pattern: Decreased step length - left;Decreased stance time - right;Step-to pattern;Right flexed knee in stance General Gait Details: BP 101/49 in sitting after walking.  Stairs: No Wheelchair Mobility Wheelchair Mobility: No    Exercises Total Joint Exercises Ankle Circles/Pumps: AROM;10 reps Quad Sets: AROM;10 reps Heel Slides: AAROM;10 reps Hip ABduction/ADduction: AAROM;10 reps Straight Leg Raises: AAROM;10 reps   PT Diagnosis:    PT Problem List:   PT Treatment Interventions:     PT Goals Acute Rehab PT Goals PT Goal Formulation: With patient Time For Goal Achievement: 03/11/12 Potential to Achieve Goals: Good PT Goal: Supine/Side to Sit - Progress: Progressing toward goal PT Goal: Sit to Stand - Progress: Progressing toward goal PT Goal: Stand to Sit - Progress: Progressing toward goal PT Goal: Ambulate - Progress: Progressing toward goal PT Goal: Perform Home Exercise Program - Progress: Progressing toward goal  Visit Information  Last PT Received On: 03/07/12 Assistance Needed: +1    Subjective Data  Subjective: Pt in bed, ready to get up and walk. Reports pain 4/10 with rest.    Cognition  Cognition Overall Cognitive Status: Appears within functional limits for tasks assessed/performed Arousal/Alertness: Awake/alert Orientation Level: Appears intact for tasks assessed;Oriented X4 / Intact Behavior During Session: [facility] for tasks performed    Balance  Balance Balance Assessed: Yes Static Standing Balance Static Standing - Balance Support: Bilateral upper extremity supported Static Standing - Level of Assistance: 4: Min assist  End of Session PT - End of Session Equipment Utilized During Treatment: Gait belt Activity Tolerance: Patient tolerated treatment well Patient left: in chair;with call bell/phone within reach Nurse  Communication: Mobility status (Nursing notified of drainage from R LE. )   GP    Donnamarie Poag, PT Rahsaan Weakland, [place],  [place] [phone] 03/07/2012, 9:23 AM

## 2012-03-07 NOTE — Consult Note (Signed)
ANTICOAGULATION CONSULT NOTE - Follow up Consult  Pharmacy Consult for Coumadin Indication: VTE prophylaxis s/p R-TKA  Allergies: No Known Allergies  Height/Weight: Dosing weight 88.2 kg  Vital Signs: BP 100/61  Pulse 93  Temp(Src) 99 F (37.2 C) (Oral)  Resp 18  SpO2 97%  Labs:  Recent Labs  03/05/12 0632 03/06/12 0650 03/07/12 0525  HGB 11.3* 9.8*  --   HCT 32.7* 27.9*  --   PLT 140* 132*  --   LABPROT 19.5* 28.1* 28.2*  INR 1.71* 2.80* 2.82*     Assessment: 61 y.o.male with Osteoarthritis of right knee s/p R-TKA receiving coumadin for dvt prophylaxis.   INR up to 2.82 after 3 doses of coumadin and 1 dose held yesterday per MD for blood filled blisters and post op anemia. No further bleeding reported. Noted plans for d/c today.    Goal of Therapy:   INR 2-3      Plan:  Coumadin 4mg  po x 1 today Recommend 4mg  daily with outpatient follow up  Thank you,  Brett Fairy, PharmD, BCPS 03/07/2012 9:48 AM

## 2012-03-07 NOTE — Progress Notes (Addendum)
     Subjective:  Patient reports pain as moderate.  He is very worried and anxious about his leg do to the severe soft tissue swelling.  Objective:   VITALS:   Filed Vitals:   03/07/12 0400 03/07/12 0540 03/07/12 0800 03/07/12 1047  BP:  100/61  94/58  Pulse:  93  92  Temp:  99 F (37.2 C)    TempSrc:  Oral    Resp: 16 18 18    SpO2: 96% 97%      The right knee has severe fracture blisters, the wounds are closed, but there is certainly surrounding erythema, and severe soft tissue swelling. The blister on the medial side has not popped, and measures about 15 cm x 10 cm. The lateral side measures about 10 cm x 10 cm.   Lab Results  Component Value Date   WBC 16.0* 03/06/2012   HGB 9.8* 03/06/2012   HCT 27.9* 03/06/2012   MCV 88.9 03/06/2012   PLT 132* 03/06/2012   INR is 2.8  Assessment/Plan: 4 Days Post-Op   Principal Problem:   Osteoarthritis of right knee Severe post op hematoma  He also has severe fracture blisters, and his INR jumped up fairly quickly after surgery, and he was on both Lovenox as well as Coumadin. I am discontinuing all of the aggressive anticoagulants, do to the severe soft tissue swelling, and the concern for wound breakdown. We will plan to start aspirin once his INR normalizes, less than 1.5. I have ordered daily INR checks as well. I would like to see his soft tissue swelling resolving, and his wounds going in the right direction prior to discharge. He is at fairly high risk for infection given the soft tissue swelling. I'm also going to begin IV antibiotics, prophylactically given his wound issues and hematoma. I'm also discontinuing his Coumadin, particularly given that the IV antibiotics may interfere with the warfarin metabolism as well.  I would anticipate discharge home on Sunday, if his wounds improve, and I will also anticipate that his blister will likely burst, and we will plan to continue with wound care and dressing changes.   Arleen Bar  P 03/07/2012, 1:41 PM   Teryl Lucy, MD Cell [phone] Pager [phone]

## 2012-03-07 NOTE — Progress Notes (Signed)
Physical Therapy Treatment Patient Details Name: Todd Howell MRN: [id number] DOB: 1950-10-15 Today's Date: 03/07/2012 Time: 8469-6295 PT Time Calculation (min): 24 min  PT Assessment / Plan / Recommendation Comments on Treatment Session  Pt presents with R TKA. Pt with decreased pain this afternoon. Moving better. With min LOB with ambulation secondary to distraction, able to recover with min A. Pt with increased swelling in R LE, placed in trendelenberg position with HOB elevated. Pt is planning to D/C home monday secodnary to medical status. will continue to follow on acute.  Appropriate for HHPT upon acute D/C.     Follow Up Recommendations  Home health PT;Supervision - Intermittent     Does the patient have the potential to tolerate intense rehabilitation     Barriers to Discharge        Equipment Recommendations  Rolling walker with 5" wheels    Recommendations for Other Services    Frequency 7X/week   Plan Discharge plan remains appropriate;Frequency remains appropriate    Precautions / Restrictions Precautions Precautions: Fall;Knee Precaution Booklet Issued: Yes (comment) Restrictions Weight Bearing Restrictions: Yes RLE Weight Bearing: Weight bearing as tolerated   Pertinent Vitals/Pain No pain at rest. Premedicated. 4/10 pain with moving.    Mobility  Bed Mobility Bed Mobility: Supine to Sit;Sitting - Scoot to Edge of Bed;Sit to Supine Supine to Sit: 4: Min assist;With rails Sitting - Scoot to [place] of Bed: 4: Min assist Sit to Supine: With rail;4: Min assist;HOB flat Scooting to [facility]: 5: Supervision Details for Bed Mobility Assistance: A needed for R LE.  Transfers Transfers: Sit to Stand;Stand to Sit Sit to Stand: 4: Min guard Stand to Sit: 4: Min guard;With upper extremity assist;To bed Details for Transfer Assistance: cues for hand placement and safety.  Ambulation/Gait Ambulation/Gait Assistance: 4: Min guard Ambulation Distance (Feet): 275  Feet Assistive device: Rolling walker Ambulation/Gait Assistance Details: cues for gait sequencing, upright posture, to decrease R stride length and increase L stride lenght. Pt with min LOB secondary to distracted, require min A to steady and recover.  Gait Pattern: Decreased step length - left;Decreased stance time - right;Step-to pattern;Right flexed knee in stance Stairs: Yes Stairs Assistance: 4: Min assist Stairs Assistance Details (indicate cue type and reason): cues for safety and proper sequencing.  Stair Management Technique: Forwards;With walker Number of Stairs: 1 (x2) Wheelchair Mobility Wheelchair Mobility: No    Exercises     PT Diagnosis:    PT Problem List:   PT Treatment Interventions:     PT Goals Acute Rehab PT Goals PT Goal Formulation: With patient Time For Goal Achievement: 03/11/12 Potential to Achieve Goals: Good Pt will go Supine/Side to Sit: with modified independence PT Goal: Supine/Side to Sit - Progress: Progressing toward goal Pt will go Sit to Supine/Side: with modified independence PT Goal: Sit to Supine/Side - Progress: Progressing toward goal Pt will go Sit to Stand: with modified independence PT Goal: Sit to Stand - Progress: Progressing toward goal Pt will go Stand to Sit: with modified independence PT Goal: Stand to Sit - Progress: Progressing toward goal Pt will Ambulate: >150 feet;with modified independence;with rolling walker PT Goal: Ambulate - Progress: Progressing toward goal Pt will Go Up / Down Stairs: 1-2 stairs;with modified independence;with rolling walker PT Goal: Up/Down Stairs - Progress: Progressing toward goal  Visit Information  Last PT Received On: 03/07/12 Assistance Needed: +1    Subjective Data  Subjective: Pt in bed with wife present. Says he has no pain at  rest. Premedicated.   Cognition  Cognition Overall Cognitive Status: Appears within functional limits for tasks assessed/performed Arousal/Alertness:  Awake/alert Orientation Level: Appears intact for tasks assessed;Oriented X4 / Intact Behavior During Session: [facility] for tasks performed    Balance  Balance Balance Assessed: No  End of Session PT - End of Session Equipment Utilized During Treatment: Gait belt Activity Tolerance: Patient tolerated treatment well Patient left: in bed;with call bell/phone within reach;with family/visitor present;with nursing in room (pt in trendelenberg position with [facility] elevated) Nurse Communication: Mobility status   GP    [place], [place], PT [phone]  [name], [place] [phone] 03/07/2012, 2:25 PM

## 2012-03-08 LAB — BASIC METABOLIC PANEL
CO2: 26 mEq/L (ref 19–32)
Chloride: 94 mEq/L — ABNORMAL LOW (ref 96–112)
Creatinine, Ser: 1.94 mg/dL — ABNORMAL HIGH (ref 0.50–1.35)
GFR calc Af Amer: 41 mL/min — ABNORMAL LOW (ref >90)
Potassium: 4.1 mEq/L (ref 3.5–5.1)
Sodium: 128 mEq/L — ABNORMAL LOW (ref 135–145)

## 2012-03-08 LAB — PROTIME-INR
INR: 2.76 — ABNORMAL HIGH (ref 0.00–1.49)
Prothrombin Time: 27.8 seconds — ABNORMAL HIGH (ref 11.6–15.2)

## 2012-03-08 LAB — CBC WITH DIFFERENTIAL/PLATELET
Basophils Relative: 0 % (ref 0–1)
Eosinophils Absolute: 0.6 10*3/uL (ref 0.0–0.7)
MCH: 31.1 pg (ref 26.0–34.0)
MCHC: 35.3 g/dL (ref 30.0–36.0)
Neutro Abs: 6.1 10*3/uL (ref 1.7–7.7)
Neutrophils Relative %: 67 % (ref 43–77)
Platelets: 195 10*3/uL (ref 150–400)
RBC: 2.86 MIL/uL — ABNORMAL LOW (ref 4.22–5.81)

## 2012-03-08 MED ORDER — SODIUM CHLORIDE 0.9 % IV SOLN
INTRAVENOUS | Status: DC
  Administered 2012-03-09 (×2): via INTRAVENOUS

## 2012-03-08 MED ORDER — HYDROCODONE-ACETAMINOPHEN 5-325 MG PO TABS
1.0000 | ORAL_TABLET | ORAL | Status: DC | PRN
  Administered 2012-03-08 – 2012-03-12 (×12): 2 via ORAL
  Administered 2012-03-12 (×2): 1 via ORAL
  Administered 2012-03-12: 2 via ORAL
  Filled 2012-03-08: qty 2
  Filled 2012-03-08: qty 1
  Filled 2012-03-08: qty 2
  Filled 2012-03-08: qty 1
  Filled 2012-03-08 (×11): qty 2

## 2012-03-08 MED ORDER — TRAMADOL HCL 50 MG PO TABS: 50.0000 mg | ORAL_TABLET | ORAL | Status: DC | PRN

## 2012-03-08 NOTE — Progress Notes (Signed)
Physical Therapy Treatment Patient Details Name: Todd Howell MRN: [id number] DOB: Nov 26, 1950 Today's Date: 03/08/2012 Time: 8295-6213 PT Time Calculation (min): 18 min  PT Assessment / Plan / Recommendation Comments on Treatment Session  Pt presents with R TKA. Pt with confused thoughts this am, nursing aware. Nodding off to sleep at times, wakes to verbal conversation. Pt is planning to D/C home monday secodnary to medical status. will continue to follow on acute.  Appropriate for HHPT upon acute D/C.     Follow Up Recommendations  Home health PT;Supervision - Intermittent           Equipment Recommendations  Rolling walker with 5" wheels    Recommendations for Other Services OT consult  Frequency 7X/week   Plan Discharge plan remains appropriate;Frequency remains appropriate    Precautions / Restrictions Precautions Precautions: Fall;Knee Required Braces or Orthoses: Knee Immobilizer - Right Knee Immobilizer - Right: Discontinue post op day 2 Restrictions Weight Bearing Restrictions: Yes RLE Weight Bearing: Weight bearing as tolerated       Mobility  Bed Mobility Supine to Sit: 4: Min assist;HOB flat Sitting - Scoot to Edge of Bed: 4: Min assist Details for Bed Mobility Assistance: cues and assist for mobility and right LE. Transfers Sit to Stand: 4: Min guard;From bed;With upper extremity assist Stand to Sit: 4: Min guard;To chair/3-in-1;With upper extremity assist;With armrests Details for Transfer Assistance: cues for hand placement and techinique with transfers. Ambulation/Gait Ambulation/Gait Assistance: 4: Min guard Ambulation Distance (Feet): 10 Feet Assistive device: Rolling walker Ambulation/Gait Assistance Details: min vc's for sequence, posture and walker use/position with mobiltiy. Gait Pattern: Step-to pattern;Antalgic;Decreased step length - left;Decreased stance time - right General Gait Details: BP 114/64 after gait.    Exercises Total Joint  Exercises Ankle Circles/Pumps: AROM;10 reps;Supine;Both Quad Sets: AROM;Strengthening;Right;10 reps;Supine Heel Slides: AAROM;Strengthening;Right;10 reps;Supine Straight Leg Raises: AAROM;Strengthening;Right;10 reps;Supine     PT Goals Acute Rehab PT Goals PT Goal: Supine/Side to Sit - Progress: Progressing toward goal PT Goal: Sit to Stand - Progress: Progressing toward goal PT Goal: Stand to Sit - Progress: Progressing toward goal PT Goal: Ambulate - Progress: Progressing toward goal PT Goal: Perform Home Exercise Program - Progress: Progressing toward goal  Visit Information  Last PT Received On: 03/08/12 Assistance Needed: +1    Subjective Data  Subjective: No new complaints, denies pain. Premedicated.   Cognition  Cognition Overall Cognitive Status: Impaired Area of Impairment: Memory;Following commands Arousal/Alertness: Awake/alert Orientation Level: Disoriented to;Situation;Time Behavior During Session: [facility] for tasks performed Memory: Decreased recall of precautions Following Commands: Follows one step commands consistently (with increased time)       End of Session PT - End of Session Equipment Utilized During Treatment: Gait belt Activity Tolerance: Patient tolerated treatment well Patient left: in chair;with call bell/phone within reach;with family/visitor present Nurse Communication: Mobility status;Other (comment)   GP     Sallyanne Kuster 03/08/2012, 1:12 PM  Sallyanne Kuster, PTA Office- [phone]

## 2012-03-08 NOTE — Progress Notes (Signed)
Subjective: 5 Days Post-Op Procedure(s) (LRB): TOTAL KNEE ARTHROPLASTY (Right) Patient reports pain as mild.   Feels his blisters are improving somewhat, the medial knee blister has now drained. Nurse and family member with some concerns for mild confusion.  Objective: Vital signs in last 24 hours: Temp:  [97.1 F (36.2 C)-98.5 F (36.9 C)] 97.1 F (36.2 C) (03/01 0555) Pulse Rate:  [90-104] 94 (03/01 0555) Resp:  [16-20] 18 (03/01 0555) BP: (109-114)/(60-65) 110/63 mmHg (03/01 0555) SpO2:  [97 %-98 %] 97 % (03/01 0555) Weight:  [88.2 kg (194 lb 7.1 oz)] 88.2 kg (194 lb 7.1 oz) (02/28 1350)  Intake/Output from previous day: 02/28 0701 - 03/01 0700 In: 300 [P.O.:300] Out: -  Intake/Output this shift: Total I/O In: 120 [P.O.:120] Out: -    Recent Labs  03/06/12 0650  HGB 9.8*    Recent Labs  03/06/12 0650  WBC 16.0*  RBC 3.14*  HCT 27.9*  PLT 132*   No results found for this basename: NA, K, CL, CO2, BUN, CREATININE, GLUCOSE, CALCIUM,  in the last 72 hours  Recent Labs  03/07/12 0525 03/08/12 0718  INR 2.82* 2.76*    Neurovascular intact Sensation intact distally Intact pulses distally Dorsiflexion/Plantar flexion intact Incision: moderate drainage Compartment soft Blisters were draining, fluid was expressed and redressed in mepilex. A&O to person, place, day, answered questions appropriately  Assessment/Plan: 5 Days Post-Op Procedure(s) (LRB): TOTAL KNEE ARTHROPLASTY (Right) Up with PT, WBAT RLE, will continue to monitor blisters, dressing change as needed, pain control as needed but continue to monitor for increased confusion.  Continue iv abx for prophylaxis.  Hold anticoagulation will start ASA once INR is <1.5.  Possible d/c tomorrow or Monday.  [place]ad[place]Ivin Booty 03/08/2012, 11:28 AM

## 2012-03-09 DIAGNOSIS — N179 Acute kidney failure, unspecified: Secondary | ICD-10-CM | POA: Diagnosis not present

## 2012-03-09 DIAGNOSIS — R410 Disorientation, unspecified: Secondary | ICD-10-CM | POA: Diagnosis not present

## 2012-03-09 DIAGNOSIS — D62 Acute posthemorrhagic anemia: Secondary | ICD-10-CM | POA: Diagnosis not present

## 2012-03-09 DIAGNOSIS — E785 Hyperlipidemia, unspecified: Secondary | ICD-10-CM | POA: Diagnosis present

## 2012-03-09 DIAGNOSIS — R569 Unspecified convulsions: Secondary | ICD-10-CM | POA: Diagnosis present

## 2012-03-09 DIAGNOSIS — I10 Essential (primary) hypertension: Secondary | ICD-10-CM | POA: Diagnosis present

## 2012-03-09 DIAGNOSIS — R404 Transient alteration of awareness: Secondary | ICD-10-CM

## 2012-03-09 DIAGNOSIS — J45909 Unspecified asthma, uncomplicated: Secondary | ICD-10-CM | POA: Diagnosis present

## 2012-03-09 DIAGNOSIS — E7800 Pure hypercholesterolemia, unspecified: Secondary | ICD-10-CM | POA: Diagnosis present

## 2012-03-09 LAB — URINALYSIS, ROUTINE W REFLEX MICROSCOPIC
Nitrite: NEGATIVE
Specific Gravity, Urine: 1.017 (ref 1.005–1.030)
pH: 5 (ref 5.0–8.0)

## 2012-03-09 LAB — CBC
HCT: 25.3 % — ABNORMAL LOW (ref 39.0–52.0)
MCH: 31.1 pg (ref 26.0–34.0)
MCV: 89.4 fL (ref 78.0–100.0)
RDW: 13.2 % (ref 11.5–15.5)
WBC: 9.1 10*3/uL (ref 4.0–10.5)

## 2012-03-09 LAB — PROTIME-INR
INR: 2.11 — ABNORMAL HIGH (ref 0.00–1.49)
Prothrombin Time: 22.8 seconds — ABNORMAL HIGH (ref 11.6–15.2)

## 2012-03-09 LAB — BASIC METABOLIC PANEL
BUN: 47 mg/dL — ABNORMAL HIGH (ref 6–23)
CO2: 27 mEq/L (ref 19–32)
Calcium: 9.2 mg/dL (ref 8.4–10.5)
Chloride: 100 mEq/L (ref 96–112)
Creatinine, Ser: 1.64 mg/dL — ABNORMAL HIGH (ref 0.50–1.35)

## 2012-03-09 LAB — URINE MICROSCOPIC-ADD ON

## 2012-03-09 MED ORDER — SILVER SULFADIAZINE 1 % EX CREA
TOPICAL_CREAM | Freq: Two times a day (BID) | CUTANEOUS | Status: DC
  Administered 2012-03-09 – 2012-03-12 (×6): via TOPICAL
  Filled 2012-03-09: qty 85

## 2012-03-09 MED ORDER — LIDOCAINE HCL 1 % IJ SOLN
5.0000 mL | Freq: Once | INTRAMUSCULAR | Status: DC
  Filled 2012-03-09: qty 5

## 2012-03-09 MED ORDER — VANCOMYCIN HCL 1000 MG IV SOLR
750.0000 mg | Freq: Two times a day (BID) | INTRAVENOUS | Status: DC
  Administered 2012-03-09 – 2012-03-11 (×5): 750 mg via INTRAVENOUS
  Filled 2012-03-09 (×9): qty 750

## 2012-03-09 MED ORDER — LIDOCAINE HCL (PF) 1 % IJ SOLN
INTRAMUSCULAR | Status: AC
  Administered 2012-03-09: 11:00:00 via INTRAMUSCULAR
  Filled 2012-03-09: qty 5

## 2012-03-09 NOTE — Progress Notes (Signed)
Physical Therapy Treatment Patient Details Name: Todd Howell MRN: [id number] DOB: 1950/09/06 Today's Date: 03/09/2012 Time: 7829-5621 PT Time Calculation (min): 62 min  PT Assessment / Plan / Recommendation Comments on Treatment Session  Pt presents with R TKA. Pt more alert and oriented today with therapy. Progressing well towards goals today. Incr time needed with mobilty. Dr Madelon Lips in during session to assess knee blisters/wounds. Assisted MD with dressing changes prior to out of bed gait.     Follow Up Recommendations  Home health PT;Supervision - Intermittent           Equipment Recommendations  Rolling walker with 5" wheels       Frequency 7X/week   Plan Discharge plan remains appropriate;Frequency remains appropriate    Precautions / Restrictions Precautions Precautions: Fall;Knee Required Braces or Orthoses: Knee Immobilizer - Right Knee Immobilizer - Right: Discontinue post op day 2 Restrictions RLE Weight Bearing: Weight bearing as tolerated       Mobility  Bed Mobility Supine to Sit: 6: Modified independent (Device/Increase time);HOB flat Sitting - Scoot to Edge of Bed: 6: Modified independent (Device/Increase time) Details for Bed Mobility Assistance: pt educated to use his LLE to support and move his RLE to edge of bed and off edge of bed with sitting up. No rails used and bed flat. min vc's for technique with incr time needed. Transfers Sit to Stand: 5: Supervision;From bed;With upper extremity assist Stand to Sit: 5: Supervision;To chair/3-in-1;With upper extremity assist;With armrests Details for Transfer Assistance: min vc's for hand placement with transfers Ambulation/Gait Ambulation/Gait Assistance: 4: Min guard;5: Supervision Ambulation Distance (Feet): 250 Feet Assistive device: Rolling walker Ambulation/Gait Assistance Details: min vc's for posture, sequence and walker postition with gait. pt progress from step to pattern to a reciprocal gait  pattern with supervison assist only. Gait Pattern: Step-through pattern;Step-to pattern;Antalgic    Exercises Total Joint Exercises Ankle Circles/Pumps: AROM;Both;10 reps;Supine Quad Sets: AROM;Strengthening;Right;10 reps;Supine Short Arc Quad: AAROM;Strengthening;Right;10 reps;Supine Heel Slides: AAROM;Strengthening;Right;10 reps;Supine Hip ABduction/ADduction: AAROM;Strengthening;Right;10 reps;Supine Straight Leg Raises: AAROM;Strengthening;Right;10 reps;Supine     PT Goals Acute Rehab PT Goals PT Goal: Supine/Side to Sit - Progress: Met PT Goal: Sit to Stand - Progress: Progressing toward goal PT Goal: Stand to Sit - Progress: Progressing toward goal PT Goal: Ambulate - Progress: Progressing toward goal PT Goal: Perform Home Exercise Program - Progress: Progressing toward goal  Visit Information  Last PT Received On: 03/09/12 Assistance Needed: +1    Subjective Data  Subjective: No new complaints, agreeable to therapy today.   Cognition  Cognition Overall Cognitive Status: Appears within functional limits for tasks assessed/performed Arousal/Alertness: Awake/alert Orientation Level: Oriented X4 / Intact;Appears intact for tasks assessed Behavior During Session: [facility] for tasks performed       End of Session PT - End of Session Equipment Utilized During Treatment: Gait belt Activity Tolerance: Patient tolerated treatment well Patient left: in chair;with call bell/phone within reach;with family/visitor present Nurse Communication: Mobility status   GP     Sallyanne Kuster 03/09/2012, 12:16 PM  Sallyanne Kuster, PTA Office- [phone]

## 2012-03-09 NOTE — Consult Note (Signed)
[facility] Medical Consultation  ABU HEAVIN NWG:[id number] DOB: [birth year] DOA: 03/03/2012 PCP: [name], MD   Requesting physician: Dr. Herbert Seta  Date of consultation: 03/09/12 Reason for consultation: confusion   Impression/Recommendations Principal Problem:   Osteoarthritis of right knee Active Problems:   Bladder cancer   Leg hematoma   Acute delirium   AKI (acute kidney injury)   Hyperlipidemia   Seizures   Asthma   Hypertension   High cholesterol   Acute blood loss anemia    1. Delirium - mild - easily reoriented - suspect from low grade fever, narcotics. Continue careful observation. Family helping by redirecting patient  2. AKI - probable from volume loss and ARB use - increase iv fluids to 100 cc/hr and recheck renal function tomorrow . send UA - monito closely for ATN. DC ARB 3. S/P TKR with postop massive hematoma extending from the heel to the iliac crest  4. ABLA - recheck CBC tomorrow - may benefit from transfusing 1 unit of PRBCs   I will followup again tomorrow. Please contact me if I can be of assistance in the meanwhile. Thank you for this consultation.  Chief Complaint: Visit for new onset confusion  HPI:  62 year old man with history of hypertension, hyperlipidemia, osteoarthritis, was admitted on February 24 to undergo a knee replacement. Unfortunately postoperatively he developed hematoma in conjunction with his DVT prophylaxis regimen. His Coumadin and Lovenox were discontinued. Starting March 1 the family has noticed that the patient was delirious. Narcotics were changed on March 1 but the patient remained delirious on March 2. Also the orthopedic surgeon has noticed that the patient has developed mild renal failure. We were consulted to help in managing this complex case.  Review of Systems:  Currently the patient reports severe right lower extremity pain every time he moves. Patient denies headaches, blurred vision, nausea vomiting, chest  pain, shortness of breath, itching, swollen lymph nodes, Reports remote history of seizures but none in the past 4 years Patient has never had allergic reactions to anesthesia Patient has had hemorrhoid problems are underwent successful surgery to relieve the symptoms  Past Medical History  Diagnosis Date  . Bladder cancer     Dr. Annabell Howells, [facility]  . Hyperlipidemia   . Seizures   . Asthma     mainly as a child  . Hypertension     sees  dr.  Sharyn Lull  . High cholesterol   . Osteoarthritis of right knee 03/03/2012   Past Surgical History  Procedure Laterality Date  . Rotator cuff repair  07/2009    right  . Bladder surgery  02/2005    TURBT   . Anal fissure repair  09/20/2011    Lat sphincterotomy  . Hemorrhoid surgery  09/20/2011    Int hemorrhoidectomy x 2  . 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 Total knee arthroplasty Right 03/03/2012    Procedure: TOTAL KNEE ARTHROPLASTY;  Surgeon: Eulas Post, MD;  Location: [facility] OR;  Service: Orthopedics;  Laterality: Right;   Social History:  reports that he quit smoking about 25 years ago. His smoking use included Cigarettes. He has a 30 pack-year smoking history. He has never used smokeless tobacco. He reports that he drinks about 1.8 ounces of alcohol per week. He reports that he does not use illicit drugs. The patient lives with his wife No Known Allergies Family History  Problem Relation Age of Onset  . Cancer Mother  colon  . Cancer Father     colon/lung   . atorvastatin  40 mg Oral q1800  .  ceFAZolin (ANCEF) IV  1 g Intravenous Q8H  . divalproex  1,000 mg Oral QHS  . docusate sodium  100 mg Oral BID  . lidocaine  5 mL Intradermal Once  . senna  1 tablet Oral BID  . silver sulfADIAZINE   Topical BID  . vancomycin  750 mg Intravenous Q12H   PRN meds  acetaminophen, acetaminophen, alum & mag hydroxide-simeth, diphenhydrAMINE, HYDROcodone-acetaminophen, menthol-cetylpyridinium, metoCLOPramide  (REGLAN) injection, metoCLOPramide, ondansetron (ZOFRAN) IV, ondansetron, phenol, polyethylene glycol, sorbitol  Physical Exam: Blood pressure 101/57, pulse 79, temperature 100.1 F (37.8 C), temperature source Rectal, resp. rate 18, height 5\' 9"  (1.753 m), weight 88.2 kg (194 lb 7.1 oz), SpO2 96.00%. Filed Vitals:   03/09/12 0400 03/09/12 0635 03/09/12 1422 03/09/12 1600  BP:  145/68 101/57   Pulse:  80 79   Temp:  98.6 F (37 C) 97.3 F (36.3 C) 100.1 F (37.8 C)  TempSrc:   Oral Rectal  Resp: 16 18 18    Height:      Weight:      SpO2: 99% 99% 96%    Patient Vitals for the past 24 hrs:  BP Temp Temp src Pulse Resp SpO2  03/09/12 1600 - 100.1 F (37.8 C) Rectal - - -  03/09/12 1422 101/57 mmHg 97.3 F (36.3 C) Oral 79 18 96 %  03/09/12 0635 145/68 mmHg 98.6 F (37 C) - 80 18 99 %  03/09/12 0400 - - - - 16 99 %  03/09/12 0000 - - - - 18 99 %  03/08/12 2256 138/65 mmHg 98.7 F (37.1 C) - 86 20 99 %  03/08/12 2000 - - - - 18 99 %     General:  Alert, calm and cooperative  Eyes: Pupils equal and round react to light accommodation  ENT: Clear pharynx without erythema  Neck: No jugular venous distention  Cardiovascular: Regular rate and rhythm without murmurs rubs or gallops  Respiratory: Clear to auscultation bilaterally without wheezes rhonchi crackles  Abdomen: Obese, soft, nontender  Skin: Pale  Musculoskeletal: Right knee is bandaged and was not examined. Posterior aspect of the right thigh has a large ecchymosis , right thigh is tense tender and red. There is an ecchymosis in the right foot.  Psychiatric: Seems able to respond appropriately, wife reports that he has been telling her stories about eating downstairs [place] food  Neurologic: Cranial nerves 2-12 intact, strength 5 out of 5 in all 4 extremities, sensation intact  Labs on Admission:  Basic Metabolic Panel:  Recent Labs Lab 03/03/12 1809 03/04/12 0620 03/08/12 1709 03/09/12 0748  NA  --   131* 128* 136  K  --  4.0 4.1 3.7  CL  --  97 94* 100  CO2  --  25 26 27   GLUCOSE  --  110* 127* 88  BUN  --  16 52* 47*  CREATININE 0.73 0.83 1.94* 1.64*  CALCIUM  --  8.7 8.7 9.2   Liver Function Tests: No results found for this basename: AST, ALT, ALKPHOS, BILITOT, PROT, ALBUMIN,  in the last 168 hours No results found for this basename: LIPASE, AMYLASE,  in the last 168 hours No results found for this basename: AMMONIA,  in the last 168 hours CBC:  Recent Labs Lab 03/04/12 0620 03/05/12 0632 03/06/12 0650 03/08/12 1709 03/09/12 0748  WBC 10.3 14.0* 16.0* 9.0 9.1  NEUTROABS  --   --   --  6.1  --   HGB 12.1* 11.3* 9.8* 8.9* 8.8*  HCT 34.7* 32.7* 27.9* 25.2* 25.3*  MCV 88.7 89.6 88.9 88.1 89.4  PLT 152 140* 132* 195 213   Cardiac Enzymes: No results found for this basename: CKTOTAL, CKMB, CKMBINDEX, TROPONINI,  in the last 168 hours BNP: No components found with this basename: POCBNP,  CBG: No results found for this basename: GLUCAP,  in the last 168 hours  Radiological Exams on Admission: No results found.   Time spent: One hour  [name],[name] [facility] Pager [phone]  If 7PM-7AM, please contact night-coverage [email or web address] Password [facility] 03/09/2012, 5:15 PM

## 2012-03-09 NOTE — Progress Notes (Signed)
Subjective: 6 Days Post-Op Procedure(s) (LRB): TOTAL KNEE ARTHROPLASTY (Right) Patient reports pain as mild and moderate.    Objective: Vital signs in last 24 hours: Temp:  [98.5 F (36.9 C)-98.7 F (37.1 C)] 98.6 F (37 C) (03/02 0635) Pulse Rate:  [80-93] 80 (03/02 0635) Resp:  [16-20] 18 (03/02 0635) BP: (111-145)/(65-70) 145/68 mmHg (03/02 0635) SpO2:  [98 %-99 %] 99 % (03/02 0635)  Intake/Output from previous day: 03/01 0701 - 03/02 0700 In: 1636.7 [P.O.:1120; I.V.:416.7; IV Piggyback:100] Out: 1300 [Urine:1300] Intake/Output this shift: Total I/O In: 240 [P.O.:240] Out: -    Recent Labs  03/08/12 1709 03/09/12 0748  HGB 8.9* 8.8*    Recent Labs  03/08/12 1709 03/09/12 0748  WBC 9.0 9.1  RBC 2.86* 2.83*  HCT 25.2* 25.3*  PLT 195 213    Recent Labs  03/08/12 1709 03/09/12 0748  NA 128* 136  K 4.1 3.7  CL 94* 100  CO2 26 27  BUN 52* 47*  CREATININE 1.94* 1.64*  GLUCOSE 127* 88  CALCIUM 8.7 9.2    Recent Labs  03/08/12 0718 03/09/12 0748  INR 2.76* 2.11*    Neurovascular intact Sensation intact distally Intact pulses distally Dorsiflexion/Plantar flexion intact Incision: moderate drainage Compartment soft Increased erythema RLE up medial thigh   Assessment/Plan: 6 Days Post-Op Procedure(s) (LRB): TOTAL KNEE ARTHROPLASTY (Right) Up with therapy Hold CPM for now Pain control as ordered DVT proph held until INR <1.5 then ASA Consult pharm for vanc dosing Culture superficial wound and knee aspirate  Blood cultures pending from yesterday Slight improvement BUN with maintenance iv overnight  Continued confusion per nursing and family consult medicine for assistance Wound care with bid silvadene cream   Todd Howell 03/09/2012, 12:17 PM

## 2012-03-09 NOTE — Progress Notes (Signed)
ANTIBIOTIC CONSULT NOTE - INITIAL  Pharmacy Consult for Vancomycin  Indication: Right knee wound issues after recent TKA  No Known Allergies  Patient Measurements: Height: 5\' 9"  (175.3 cm) Weight: 194 lb 7.1 oz (88.2 kg) IBW/kg (Calculated) : 70.7  Vital Signs: Temp: 98.6 F (37 C) (03/02 0635) BP: 145/68 mmHg (03/02 0635) Pulse Rate: 80 (03/02 0635) Intake/Output from previous day: 03/01 0701 - 03/02 0700 In: 1636.7 [P.O.:1120; I.V.:416.7; IV Piggyback:100] Out: 1300 [Urine:1300] Intake/Output from this shift: Total I/O In: 240 [P.O.:240] Out: -   Labs:  Recent Labs  03/08/12 1709 03/09/12 0748  WBC 9.0 9.1  HGB 8.9* 8.8*  PLT 195 213  CREATININE 1.94* 1.64*   Estimated Creatinine Clearance: 51.3 ml/min (by C-G formula based on Cr of 1.64). No results found for this basename: VANCOTROUGH, VANCOPEAK, VANCORANDOM, GENTTROUGH, GENTPEAK, GENTRANDOM, TOBRATROUGH, TOBRAPEAK, TOBRARND, AMIKACINPEAK, AMIKACINTROU, AMIKACIN,  in the last 72 hours   Microbiology: No results found for this or any previous visit (from the past 720 hour(s)).  Medical History: Past Medical History  Diagnosis Date  . Bladder cancer     Dr. Annabell Howells, [facility]  . Hyperlipidemia   . Seizures   . Asthma     mainly as a child  . Hypertension     sees  dr.  Sharyn Lull  . High cholesterol   . Osteoarthritis of right knee 03/03/2012   Assessment: Mr. Heal is a 1 YOM s/p R knee TKA to begin vancomycin in addition to cefazolin (per MD) for empiric coverage of draining blisters around R knee. Noted that cefazolin was ordered x 8 doses and course will be completed tomorrow.   2/28: ancef x 8 doses to end 3/3 3/2: vanc >>  Cultures 3/2: body fluid- sent 3/1: wound - sent 3/1: blood x 2 - sent   Patient's estimated clearance is 48ml/min. Scr has come down since yesterday. Currently, patient is afebrile and WBC count is WNL.   Goal of Therapy:  Vancomycin trough level 15-20  mcg/ml  Plan:  Begin vancomycin 750mg  IV q12h F/u renal function, cultures, and trough at [facility] if necessary  Thank you,  Brett Fairy, PharmD, BCPS 03/09/2012 11:00 AM

## 2012-03-10 DIAGNOSIS — IMO0002 Reserved for concepts with insufficient information to code with codable children: Secondary | ICD-10-CM

## 2012-03-10 DIAGNOSIS — M1710 Unilateral primary osteoarthritis, unspecified knee: Secondary | ICD-10-CM

## 2012-03-10 LAB — BASIC METABOLIC PANEL
Calcium: 8.7 mg/dL (ref 8.4–10.5)
GFR calc Af Amer: 57 mL/min — ABNORMAL LOW (ref >90)
GFR calc non Af Amer: 49 mL/min — ABNORMAL LOW (ref >90)
Glucose, Bld: 94 mg/dL (ref 70–99)
Sodium: 138 mEq/L (ref 135–145)

## 2012-03-10 LAB — CBC
MCH: 30.8 pg (ref 26.0–34.0)
Platelets: 203 10*3/uL (ref 150–400)
RBC: 2.66 MIL/uL — ABNORMAL LOW (ref 4.22–5.81)
WBC: 9.5 10*3/uL (ref 4.0–10.5)

## 2012-03-10 LAB — PROTIME-INR
INR: 1.76 — ABNORMAL HIGH (ref 0.00–1.49)
Prothrombin Time: 19.9 seconds — ABNORMAL HIGH (ref 11.6–15.2)

## 2012-03-10 MED ORDER — ASPIRIN 325 MG PO TABS
325.0000 mg | ORAL_TABLET | Freq: Every day | ORAL | Status: DC
  Administered 2012-03-10 – 2012-03-12 (×3): 325 mg via ORAL
  Filled 2012-03-10 (×3): qty 1

## 2012-03-10 MED ORDER — CEPHALEXIN 500 MG PO CAPS: 500.0000 mg | ORAL_CAPSULE | Freq: Four times a day (QID) | ORAL | Status: DC

## 2012-03-10 NOTE — Progress Notes (Signed)
Occupational Therapy Treatment Patient Details Name: Todd Howell MRN: [id number] DOB: 07/02/1950 Today's Date: 03/10/2012 Time:  -     OT Assessment / Plan / Recommendation Comments on Treatment Session Pt making progress and doing well. Pt should continue with OT services to maximize level of function to return home safely    Follow Up Recommendations  Home health OT    Barriers to Discharge   None    Equipment Recommendations      Recommendations for Other Services    Frequency Min 2X/week   Plan Discharge plan remains appropriate    Precautions / Restrictions Precautions Precautions: Fall;Knee Required Braces or Orthoses: Knee Immobilizer - Right Restrictions Weight Bearing Restrictions: Yes RLE Weight Bearing: Weight bearing as tolerated   Pertinent Vitals/Pain     ADL  Lower Body Bathing: Simulated;Minimal assistance;Other (comment) (LH sponge) Lower Body Dressing: Minimal assistance;Other (comment);Simulated;Performed (using sock aid and reacher) Where Assessed - Lower Body Dressing: Unsupported sitting Equipment Used: Reacher;Sock aid    OT Diagnosis:    OT Problem List:   OT Treatment Interventions:     OT Goals ADL Goals ADL Goal: Lower Body Bathing - Progress: Progressing toward goals ADL Goal: Lower Body Dressing - Progress: Progressing toward goals  Visit Information  Last OT Received On: 03/10/12    Subjective Data  Subjective: " I think they will let me go home tomorrow " Patient Stated Goal: To return home   Prior Functioning       Cognition  Cognition Overall Cognitive Status: Appears within functional limits for tasks assessed/performed Orientation Level: Appears intact for tasks assessed Behavior During Session: [facility] for tasks performed    Mobility  Bed Mobility Bed Mobility: Supine to Sit;Sitting - Scoot to [place] of Bed;Sit to Supine Supine to Sit: 4: Min assist Sitting - Scoot to Edge of Bed: 6: Modified independent  (Device/Increase time) Sit to Supine: With rail;4: Min assist;HOB flat Scooting to [facility]: 5: Supervision Details for Bed Mobility Assistance: Pt requires min A to advance R LE onto and off of bed.     Exercises      Balance Balance Balance Assessed: No   End of Session OT - End of Session Activity Tolerance: Patient tolerated treatment well Patient left: in bed  GO     Galen Manila 03/10/2012, 3:28 PM

## 2012-03-10 NOTE — Progress Notes (Signed)
    Mr. Manon has been having a difficult time, and has had an episode of acute renal insufficiency, and I appreciate the internal medicine doctors management.  He has soft tissue injury to his knee, likely due to the correction of severe deformity, with blistering around the incision with weeping wounds. His knee was aspirated, and cultured by Dr. Madelon Lips over the weekend. All of this has been negative from an infection standpoint.  He has been on IV Ancef, and was switched over to IV vancomycin by Dr. Madelon Lips.  Part of a challenge in his management is that he does not actually have an infection, at least not that I know of within his knee, and the antibiotics do have negative affects on his kidneys.  Regarding to continue with the antibiotics, as his kidney function seems to be improving, for at least another day, and will anticipate discharge home tomorrow, with wound care, and I am going to restart his aspirin today given that his INR is now 1.7.

## 2012-03-10 NOTE — Progress Notes (Signed)
Dressing change performed. 3 large blisters on both lateral and medial sides of the knee. Both blisters were leaking copious amounts of serosanguinous fluids. Reddened area around the knee but incision approximated. ABD and Kerlix dressing reapplied to the knee.

## 2012-03-10 NOTE — Progress Notes (Signed)
Patient ID: Todd Howell, male   DOB: 04-Aug-1950, 62 y.o.   MRN: [id number]     Subjective:  Patient reports pain as mild to moderate.  He states that he is feeling better.  Objective:   VITALS:   Filed Vitals:   03/09/12 2000 03/09/12 2020 03/10/12 0000 03/10/12 0635  BP:  125/67  126/81  Pulse:  83  86  Temp:  98.9 F (37.2 C)  100 F (37.8 C)  TempSrc:  Oral  Oral  Resp: 20 18 18 17   Height:      Weight:      SpO2: 98% 98%  97%    ABD soft Sensation intact distally Dorsiflexion/Plantar flexion intact Incision: dressing C/D/I and scant drainage INR 1.76 today down from 2+ yesterday Severe fracture blisters with serous drainage still, still significant swelling   Lab Results  Component Value Date   WBC 9.5 03/10/2012   HGB 8.2* 03/10/2012   HCT 24.1* 03/10/2012   MCV 90.6 03/10/2012   PLT 203 03/10/2012     Assessment/Plan: 7 Days Post-Op   Principal Problem:   Osteoarthritis of right knee Active Problems:   Bladder cancer   Leg hematoma   Acute delirium   AKI (acute kidney injury)   Hyperlipidemia   Seizures   Asthma   Hypertension   High cholesterol   Acute blood loss anemia   Advance diet Up with therapy Plan for discharge tomorrow Severe postop hematoma of knee with fracture blisters, likely from correction of 35-40 degree flexion contracture.  Wound care per Dr. Lajoyce Corners for his leg, consult pending, will restart aspirin 325 now that INR down.   C/w iv abx for now, preventative given weeping post op wounds, although I do not believe he has an infection, but rather severe soft tissue envelope compromise due to soft tissue stretch from correction of deformity combined with post op lovenox bridging to coumadin.  Haskel Khan 03/10/2012, 7:58 AM   Teryl Lucy, MD Cell [phone] Pager [phone]

## 2012-03-10 NOTE — Progress Notes (Signed)
Physical Therapy Treatment Patient Details Name: Todd Howell MRN: [id number] DOB: [birth year] Today's Date: 03/10/2012 Time: 4098-1191 PT Time Calculation (min): 26 min  PT Assessment / Plan / Recommendation Comments on Treatment Session  Pt presenets with R TKA. Pt more alert today with therapy. C/o increase pain initially with ambulation, pain decreased with continued activity. Pt progressing well towards goals, limited by pain.     Follow Up Recommendations  Home health PT;Supervision - Intermittent     Does the patient have the potential to tolerate intense rehabilitation     Barriers to Discharge        Equipment Recommendations  Rolling walker with 5" wheels    Recommendations for Other Services    Frequency 7X/week   Plan Discharge plan remains appropriate;Frequency remains appropriate    Precautions / Restrictions Precautions Precautions: Fall;Knee Restrictions Weight Bearing Restrictions: Yes RLE Weight Bearing: Weight bearing as tolerated   Pertinent Vitals/Pain 4-7/10 throughout treatment.  Pt premedicated.      Mobility  Bed Mobility Bed Mobility: Supine to Sit;Sitting - Scoot to [place] of Bed;Sit to Supine Supine to Sit: 4: Min assist;With rails Sitting - Scoot to [place] of Bed: 6: Modified independent (Device/Increase time) Sit to Supine: With rail;4: Min assist;HOB flat Details for Bed Mobility Assistance: Pt requires min A to advance R LE onto and off of bed.  Transfers Transfers: Sit to Stand;Stand to Sit Sit to Stand: From bed;5: Supervision;With upper extremity assist Stand to Sit: 5: Supervision;To bed;With upper extremity assist Details for Transfer Assistance: min vc's for hand placement and safety with transfers Ambulation/Gait Ambulation/Gait Assistance: 4: Min guard;5: Supervision Ambulation Distance (Feet): 250 Feet Assistive device: Rolling walker Ambulation/Gait Assistance Details: cues for posture, gait sequencing, c/o increase pain at  beginning of ambulation 7/10, pain reduced during activity to 4/10. Gait Pattern: Step-to pattern;Decreased step length - left;Decreased stance time - right Stairs: No Wheelchair Mobility Wheelchair Mobility: No    Exercises Total Joint Exercises Ankle Circles/Pumps: AROM;15 reps;Supine;Both Quad Sets: AROM;Strengthening;10 reps;Supine;Right Heel Slides: AAROM;10 reps;Supine;Right Hip ABduction/ADduction: AAROM;Strengthening;Right;15 reps;Supine   PT Diagnosis:    PT Problem List:   PT Treatment Interventions:     PT Goals Acute Rehab PT Goals PT Goal Formulation: With patient Time For Goal Achievement: 03/11/12 Potential to Achieve Goals: Good PT Goal: Supine/Side to Sit - Progress: Progressing toward goal Pt will go Sit to Supine/Side: with modified independence PT Goal: Sit to Supine/Side - Progress: Progressing toward goal PT Goal: Sit to Stand - Progress: Progressing toward goal PT Goal: Stand to Sit - Progress: Progressing toward goal PT Goal: Ambulate - Progress: Progressing toward goal PT Goal: Perform Home Exercise Program - Progress: Progressing toward goal  Visit Information  Last PT Received On: 03/10/12 Assistance Needed: +1    Subjective Data  Subjective: Pt reports he had a "rough weekend", C/o with increase pain with movement. Agreeable to therapy.   Cognition  Cognition Overall Cognitive Status: Appears within functional limits for tasks assessed/performed Arousal/Alertness: Awake/alert Orientation Level: Appears intact for tasks assessed Behavior During Session: [facility] for tasks performed    Balance  Balance Balance Assessed: No  End of Session PT - End of Session Equipment Utilized During Treatment: Gait belt Activity Tolerance: Patient tolerated treatment well Patient left: with call bell/phone within reach;with family/visitor present;in bed Nurse Communication: Mobility status   GP    Donnamarie Poag, PT Kizzie Ide [place], [place] [phone] 03/10/2012,  9:21 AM

## 2012-03-10 NOTE — Progress Notes (Signed)
[facility] PROGRESS NOTE  Todd Howell WGN:[id number] DOB: [birth year] DOA: 03/03/2012 PCP: Todd Pane, MD  Assessment/Plan: 1. Delirium 03/08/12 - mild - easily reoriented - suspect from low grade fever, narcotics. Continue careful observation. Family helping by redirecting patient Resolved by 03/09/12 . Avoid narcotics.  2. AKI - probable from volume loss and ARB use - increased iv fluids to 100 cc/hr from 3/1 until 03/09/12 with improvement in creatinine level. By March 2 the patient was deemed euvolemic and IV fluids were stopped.   - UA 03/08/12 with active sediment, positive for protein, leukocyte esterase, white blood cells, red blood cells, granular cast. Suspect patient may have a component of ATN versus AIN from hypotension and maybe from Ancef. - monitor closely renal function DCed ARB 03/08/12  3. S/P TKR with postop massive hematoma extending from the heel to the iliac crest  4. ABLA - per primary service  Code Status: full Family Communication: wife  Disposition Plan: home    Antibiotics:  Ancef 2/28- 3/3   Vancomycin 03/08/12 -   HPI/Subjective: Feels just a little bit better  Objective: Filed Vitals:   03/09/12 2020 03/10/12 0000 03/10/12 0635 03/10/12 0800  BP: 125/67  126/81   Pulse: 83  86   Temp: 98.9 F (37.2 C)  100 F (37.8 C)   TempSrc: Oral  Oral   Resp: 18 18 17 12   Height:      Weight:      SpO2: 98%  97% 97%    Intake/Output Summary (Last 24 hours) at 03/10/12 1415 Last data filed at 03/10/12 0636  Gross per 24 hour  Intake 3008.33 ml  Output   1550 ml  Net 1458.33 ml   Filed Weights   03/07/12 1350  Weight: 88.2 kg (194 lb 7.1 oz)    Exam:   General:  Alert and oriented x3  Cardiovascular: Regular rate and rhythm without murmurs rubs or gallops  Respiratory: Clear to auscultation bilaterally  Abdomen: Soft nontender nondistended bowel sounds are present  Musculoskeletal: Tenderness right lower extremity   Data  Reviewed: Basic Metabolic Panel:  Recent Labs Lab 03/03/12 1809 03/04/12 0620 03/08/12 1709 03/09/12 0748 03/10/12 0620  NA  --  131* 128* 136 138  K  --  4.0 4.1 3.7 3.9  CL  --  97 94* 100 103  CO2  --  25 26 27 27   GLUCOSE  --  110* 127* 88 94  BUN  --  16 52* 47* 43*  CREATININE 0.73 0.83 1.94* 1.64* 1.48*  CALCIUM  --  8.7 8.7 9.2 8.7   Liver Function Tests: No results found for this basename: AST, ALT, ALKPHOS, BILITOT, PROT, ALBUMIN,  in the last 168 hours No results found for this basename: LIPASE, AMYLASE,  in the last 168 hours No results found for this basename: AMMONIA,  in the last 168 hours CBC:  Recent Labs Lab 03/05/12 0632 03/06/12 0650 03/08/12 1709 03/09/12 0748 03/10/12 0620  WBC 14.0* 16.0* 9.0 9.1 9.5  NEUTROABS  --   --  6.1  --   --   HGB 11.3* 9.8* 8.9* 8.8* 8.2*  HCT 32.7* 27.9* 25.2* 25.3* 24.1*  MCV 89.6 88.9 88.1 89.4 90.6  PLT 140* 132* 195 213 203   Cardiac Enzymes: No results found for this basename: CKTOTAL, CKMB, CKMBINDEX, TROPONINI,  in the last 168 hours BNP (last 3 results) No results found for this basename: PROBNP,  in the last 8760 hours CBG: No results  found for this basename: GLUCAP,  in the last 168 hours  Recent Results (from the past 240 hour(s))  CULTURE, BLOOD (ROUTINE X 2)     Status: None   Collection Time    03/08/12  5:30 PM      Result Value Range Status   Specimen Description BLOOD RIGHT HAND   Final   Special Requests BOTTLES DRAWN AEROBIC ONLY 10CC   Final   Culture  Setup Time 03/08/2012 23:24   Final   Culture     Final   Value:        BLOOD CULTURE RECEIVED NO GROWTH TO DATE CULTURE WILL BE HELD FOR 5 DAYS BEFORE ISSUING A FINAL NEGATIVE REPORT   Report Status PENDING   Incomplete  WOUND CULTURE     Status: None   Collection Time    03/09/12 10:30 AM      Result Value Range Status   Specimen Description WOUND   Final   Special Requests SWAB OF DRAINAGE FROM RIGHT KNEE    Final   Gram Stain  PENDING   Incomplete   Culture NO GROWTH 1 DAY   Final   Report Status PENDING   Incomplete  ANAEROBIC CULTURE     Status: None   Collection Time    03/09/12 10:30 AM      Result Value Range Status   Specimen Description WOUND KNEE RIGHT   Final   Special Requests NONE   Final   Gram Stain PENDING   Incomplete   Culture     Final   Value: NO ANAEROBES ISOLATED; CULTURE IN PROGRESS FOR 5 DAYS   Report Status PENDING   Incomplete  BODY FLUID CULTURE     Status: None   Collection Time    03/09/12 10:31 AM      Result Value Range Status   Specimen Description SYNOVIAL FLUID RIGHT KNEE   Final   Special Requests SWAB SPECIMEN SUBMITTED   Final   Gram Stain     Final   Value: WBC PRESENT, PREDOMINANTLY PMN     NO ORGANISMS SEEN   Culture NO GROWTH 1 DAY   Final   Report Status PENDING   Incomplete     Studies: No results found.  Scheduled Meds: . atorvastatin  40 mg Oral q1800  . divalproex  1,000 mg Oral QHS  . docusate sodium  100 mg Oral BID  . lidocaine  5 mL Intradermal Once  . senna  1 tablet Oral BID  . silver sulfADIAZINE   Topical BID  . vancomycin  750 mg Intravenous Q12H   Continuous Infusions: . sodium chloride 100 mL/hr at 03/10/12 0500    Principal Problem:   Osteoarthritis of right knee Active Problems:   Bladder cancer   Leg hematoma   Acute delirium   AKI (acute kidney injury)   Hyperlipidemia   Seizures   Asthma   Hypertension   High cholesterol   Acute blood loss anemia    Todd Howell  [facility] Pager [phone]. If 7PM-7AM, please contact night-coverage at [email or web address], password [facility] 03/10/2012, 2:15 PM  LOS: 7 days

## 2012-03-11 LAB — WOUND CULTURE: Culture: NO GROWTH

## 2012-03-11 LAB — BASIC METABOLIC PANEL
BUN: 40 mg/dL — ABNORMAL HIGH (ref 6–23)
GFR calc non Af Amer: 50 mL/min — ABNORMAL LOW (ref >90)
Glucose, Bld: 89 mg/dL (ref 70–99)
Potassium: 4.2 mEq/L (ref 3.5–5.1)

## 2012-03-11 NOTE — Progress Notes (Signed)
Physical Therapy Treatment Patient Details Name: Todd Howell MRN: [id number] DOB: [birth year] Today's Date: 03/11/2012 Time: 7829-5621 PT Time Calculation (min): 23 min  PT Assessment / Plan / Recommendation Comments on Treatment Session  Pt presents with R TKA. Pt c/o increase pain with moving today. pain 7/10 with ambulation. Pt urged to increase activity with nursing, to and from bathroom to continue mobility. Feel pt is ready from PT standpoint to D/C home with supervision and HHPT.    Follow Up Recommendations  Home health PT;Supervision - Intermittent     Does the patient have the potential to tolerate intense rehabilitation     Barriers to Discharge        Equipment Recommendations  Rolling walker with 5" wheels    Recommendations for Other Services    Frequency 7X/week   Plan Discharge plan remains appropriate;Frequency remains appropriate    Precautions / Restrictions Precautions Precautions: Fall;Knee Restrictions Weight Bearing Restrictions: Yes RLE Weight Bearing: Weight bearing as tolerated   Pertinent Vitals/Pain Indicates pain 7/10.  Premedicated.      Mobility  Bed Mobility Bed Mobility: Supine to Sit;Sitting - Scoot to Edge of Bed Supine to Sit: 4: Min assist Sitting - Scoot to Edge of Bed: 6: Modified independent (Device/Increase time) Details for Bed Mobility Assistance: Requires min A to advance R LE to EOB.  Transfers Transfers: Sit to Stand;Stand to Sit Sit to Stand: 5: Supervision;With upper extremity assist;From bed Stand to Sit: 4: Min guard;With upper extremity assist;To chair/3-in-1;With armrests Details for Transfer Assistance: cues for hand placement, RW safety and to control descent to surface with stand to sit. Ambulation/Gait Ambulation/Gait Assistance: 5: Supervision Ambulation Distance (Feet): 250 Feet Assistive device: Rolling walker Ambulation/Gait Assistance Details: cues for upright posture, gait sequencing, pt can correct gait  sequencing with cues.  Gait Pattern: Step-to pattern;Decreased step length - left;Decreased stance time - right;Trunk flexed Stairs: No Wheelchair Mobility Wheelchair Mobility: No    Exercises Total Joint Exercises Ankle Circles/Pumps: AROM;10 reps Heel Slides: AAROM;10 reps   PT Diagnosis:    PT Problem List:   PT Treatment Interventions:     PT Goals Acute Rehab PT Goals PT Goal Formulation: With patient Time For Goal Achievement: 03/11/12 Potential to Achieve Goals: Good PT Goal: Supine/Side to Sit - Progress: Progressing toward goal PT Goal: Sit to Stand - Progress: Progressing toward goal PT Goal: Stand to Sit - Progress: Progressing toward goal PT Goal: Ambulate - Progress: Progressing toward goal  Visit Information  Last PT Received On: 03/11/12 Assistance Needed: +1    Subjective Data  Subjective: Pt reports he had a "bad night". Did not feel as though nurses helped him. Reports pain 3/10 in back of knee while in bed.    Cognition  Cognition Overall Cognitive Status: Appears within functional limits for tasks assessed/performed Arousal/Alertness: Awake/alert Orientation Level: Appears intact for tasks assessed Behavior During Session: [facility] for tasks performed    Balance  Balance Balance Assessed: Yes Static Standing Balance Static Standing - Balance Support: No upper extremity supported Static Standing - Level of Assistance: 5: Stand by assistance  End of Session PT - End of Session Equipment Utilized During Treatment: Gait belt Activity Tolerance: Patient tolerated treatment well Patient left: in chair;with call bell/phone within reach Nurse Communication: Mobility status   GP    [place], [place], [place] [phone] [name], [place] [phone] 03/11/2012, 9:20 AM

## 2012-03-11 NOTE — Progress Notes (Signed)
Patient ID: Todd Howell, male   DOB: [birth year], 62 y.o.   MRN: [id number]     Subjective:  Patient reports pain as mild to moderate.  He is very concerned about his wound.  He is also not able to get around like he would like.  Objective:   VITALS:   Filed Vitals:   03/10/12 0800 03/10/12 1437 03/11/12 0237 03/11/12 0609  BP:  106/69 127/76 134/77  Pulse:  74 76 80  Temp:  98.6 F (37 C) 98 F (36.7 C) 98 F (36.7 C)  TempSrc:      Resp: 12 18 18 18   Height:      Weight:      SpO2: 97% 95% 96% 97%    ABD soft Sensation intact distally Dorsiflexion/Plantar flexion intact Incision: dressing C/D/I and moderate drainage Blisters draining, serous, no purulence, moderate hematoma and effusion.    Lab Results  Component Value Date   WBC 9.5 03/10/2012   HGB 8.2* 03/10/2012   HCT 24.1* 03/10/2012   MCV 90.6 03/10/2012   PLT 203 03/10/2012     Assessment/Plan: 8 Days Post-Op   Principal Problem:   Osteoarthritis of right knee Active Problems:   Bladder cancer   Leg hematoma   Acute delirium   AKI (acute kidney injury)   Hyperlipidemia   Seizures   Asthma   Hypertension   High cholesterol   Acute blood loss anemia   Advance diet Up with therapy Plan for discharge tomorrow Will plan to check wound tomorrow and change dressing again before DC home. ABLA continue to monitor  Torrie Mayers, BRANDON 03/11/2012, 10:03 AM   Teryl Lucy, MD Cell [phone] Pager [phone]

## 2012-03-11 NOTE — Progress Notes (Signed)
[facility] CONSULT PROGRESS NOTE  Todd Howell WGN:[id number] DOB: 03-Apr-1950 DOA: 03/03/2012 PCP: [name], MD  Assessment/Plan: Delirium 03/08/12 Mild - easily reoriented - suspect from low grade fever, narcotics. Resolved, continue careful observation.  Avoid narcotics if possible.  Acute renal failure/Acute Kidney Injury Improved. Probable from volume loss and ARB use. Increased iv fluids to 100 cc/hr from 3/1 until 03/09/12 with improvement in creatinine level. By 03/09/2012 the patient was deemed euvolemic and IV fluids were stopped. UA 03/08/12 with active sediment, positive for protein, leukocyte esterase, white blood cells, red blood cells, granular cast. Suspect patient may have a component of ATN versus AIN from hypotension and maybe from Ancef. Monitor closely renal function. DCed ARB 03/08/12.  Hypertension Stable off ARB, currently not on any antihypertensive medications. Would have patient followup with his PCP in 1 week for blood pressure check to determine if he needs to be restarted on an antihypertensive medication.  S/P TKR with postop massive hematoma extending from the heel to the iliac crest  Management as per primary service.  Acute blood loss anemia Per primary service.  Code Status: Full Family Communication: Wife  Disposition Plan: Per primary service.    Antibiotics:  Ancef 2/28- 3/3   Vancomycin 03/08/12 -   Will sign off as patient is medically stable, please do not hesitate to contact us with any questions or concerns.  HPI/Subjective: Got out the bed today, feeling better.  Objective: Filed Vitals:   03/10/12 0800 03/10/12 1437 03/11/12 0237 03/11/12 0609  BP:  106/69 127/76 134/77  Pulse:  74 76 80  Temp:  98.6 F (37 C) 98 F (36.7 C) 98 F (36.7 C)  TempSrc:      Resp: 12 18 18 18   Height:      Weight:      SpO2: 97% 95% 96% 97%    Intake/Output Summary (Last 24 hours) at 03/11/12 7846 Last data filed at 03/11/12 9629  Gross per 24 hour  Intake    960 ml  Output   1150 ml  Net   -190 ml   Filed Weights   03/07/12 1350  Weight: 88.2 kg (194 lb 7.1 oz)    Exam:   General:  Alert and oriented x3  Cardiovascular: Regular rate and rhythm without murmurs rubs or gallops  Respiratory: Clear to auscultation bilaterally  Abdomen: Soft nontender nondistended bowel sounds are present  Musculoskeletal: Tenderness right lower extremity, covered in bandages.  Data Reviewed: Basic Metabolic Panel:  Recent Labs Lab 03/08/12 1709 03/09/12 0748 03/10/12 0620 03/11/12 0550  NA 128* 136 138 143  K 4.1 3.7 3.9 4.2  CL 94* 100 103 107  CO2 26 27 27 29   GLUCOSE 127* 88 94 89  BUN 52* 47* 43* 40*  CREATININE 1.94* 1.64* 1.48* 1.45*  CALCIUM 8.7 9.2 8.7 8.8   Liver Function Tests: No results found for this basename: AST, ALT, ALKPHOS, BILITOT, PROT, ALBUMIN,  in the last 168 hours No results found for this basename: LIPASE, AMYLASE,  in the last 168 hours No results found for this basename: AMMONIA,  in the last 168 hours CBC:  Recent Labs Lab 03/05/12 0632 03/06/12 0650 03/08/12 1709 03/09/12 0748 03/10/12 0620  WBC 14.0* 16.0* 9.0 9.1 9.5  NEUTROABS  --   --  6.1  --   --   HGB 11.3* 9.8* 8.9* 8.8* 8.2*  HCT 32.7* 27.9* 25.2* 25.3* 24.1*  MCV 89.6 88.9 88.1 89.4 90.6  PLT 140* 132*  195 213 203   Cardiac Enzymes: No results found for this basename: CKTOTAL, CKMB, CKMBINDEX, TROPONINI,  in the last 168 hours BNP (last 3 results) No results found for this basename: PROBNP,  in the last 8760 hours CBG: No results found for this basename: GLUCAP,  in the last 168 hours  Recent Results (from the past 240 hour(s))  CULTURE, BLOOD (ROUTINE X 2)     Status: None   Collection Time    03/08/12  5:20 PM      Result Value Range Status   Specimen Description BLOOD LEFT ARM   Final   Special Requests BOTTLES DRAWN AEROBIC AND ANAEROBIC 10CC   Final   Culture  Setup Time 03/10/2012 14:43   Final    Culture     Final   Value:        BLOOD CULTURE RECEIVED NO GROWTH TO DATE CULTURE WILL BE HELD FOR 5 DAYS BEFORE ISSUING A FINAL NEGATIVE REPORT   Report Status PENDING   Incomplete  CULTURE, BLOOD (ROUTINE X 2)     Status: None   Collection Time    03/08/12  5:30 PM      Result Value Range Status   Specimen Description BLOOD RIGHT HAND   Final   Special Requests BOTTLES DRAWN AEROBIC ONLY 10CC   Final   Culture  Setup Time 03/08/2012 23:24   Final   Culture     Final   Value:        BLOOD CULTURE RECEIVED NO GROWTH TO DATE CULTURE WILL BE HELD FOR 5 DAYS BEFORE ISSUING A FINAL NEGATIVE REPORT   Report Status PENDING   Incomplete  WOUND CULTURE     Status: None   Collection Time    03/09/12 10:30 AM      Result Value Range Status   Specimen Description WOUND   Final   Special Requests SWAB OF DRAINAGE FROM RIGHT KNEE    Final   Gram Stain     Final   Value: NO WBC SEEN     NO SQUAMOUS EPITHELIAL CELLS SEEN     NO ORGANISMS SEEN   Culture NO GROWTH 1 DAY   Final   Report Status PENDING   Incomplete  ANAEROBIC CULTURE     Status: None   Collection Time    03/09/12 10:30 AM      Result Value Range Status   Specimen Description WOUND KNEE RIGHT   Final   Special Requests NONE   Final   Gram Stain PENDING   Incomplete   Culture     Final   Value: NO ANAEROBES ISOLATED; CULTURE IN PROGRESS FOR 5 DAYS   Report Status PENDING   Incomplete  BODY FLUID CULTURE     Status: None   Collection Time    03/09/12 10:31 AM      Result Value Range Status   Specimen Description SYNOVIAL FLUID RIGHT KNEE   Final   Special Requests SWAB SPECIMEN SUBMITTED   Final   Gram Stain     Final   Value: WBC PRESENT, PREDOMINANTLY PMN     NO ORGANISMS SEEN   Culture NO GROWTH 1 DAY   Final   Report Status PENDING   Incomplete     Studies: No results found.  Scheduled Meds: . aspirin  325 mg Oral Daily  . atorvastatin  40 mg Oral q1800  . divalproex  1,000 mg Oral QHS  . docusate sodium   100 mg  Oral BID  . lidocaine  5 mL Intradermal Once  . senna  1 tablet Oral BID  . silver sulfADIAZINE   Topical BID  . vancomycin  750 mg Intravenous Q12H   Continuous Infusions:    Principal Problem:   Osteoarthritis of right knee Active Problems:   Bladder cancer   Leg hematoma   Acute delirium   AKI (acute kidney injury)   Hyperlipidemia   Seizures   Asthma   Hypertension   High cholesterol   Acute blood loss anemia    REDDY,SRIKAR A, MD [facility] Pager [phone]. If 7PM-7AM, please contact night-coverage at [email or web address], password [facility] 03/11/2012, 8:52 AM  LOS: 8 days

## 2012-03-12 LAB — BODY FLUID CULTURE: Culture: NO GROWTH

## 2012-03-12 NOTE — Progress Notes (Signed)
Pt discharged to home accompanied by family. Discharge instructions and rx given and explained. Pt and family stated understanding. Pts IV was removed. Pt left unit in a stable condition via wheelchair.

## 2012-03-12 NOTE — Progress Notes (Signed)
Patient ID: JAMESPAUL SECRIST, male   DOB: [birth year], 62 y.o.   MRN: [id number]     Subjective:  Patient reports pain as mild.  He was feeling better and was up and walking with PT  Objective:   VITALS:   Filed Vitals:   03/11/12 0609 03/11/12 1432 03/11/12 2222 03/12/12 0652  BP: 134/77 113/66 123/62 130/84  Pulse: 80 78 86 82  Temp: 98 F (36.7 C) 97.4 F (36.3 C) 98.3 F (36.8 C) 98.9 F (37.2 C)  TempSrc:      Resp: 18 18 20 20   Height:      Weight:      SpO2: 97% 98% 98% 99%    ABD soft Sensation intact distally Dorsiflexion/Plantar flexion intact Incision: dressing C/D/I   Lab Results  Component Value Date   WBC 9.5 03/10/2012   HGB 8.2* 03/10/2012   HCT 24.1* 03/10/2012   MCV 90.6 03/10/2012   PLT 203 03/10/2012     Assessment/Plan: 9 Days Post-Op   Principal Problem:   Osteoarthritis of right knee Active Problems:   Bladder cancer   Leg hematoma   Acute delirium   AKI (acute kidney injury)   Hyperlipidemia   Seizures   Asthma   Hypertension   High cholesterol   Acute blood loss anemia   Advance diet Up with therapy Discharge home with home health With wound care   Haskel Khan 03/12/2012, 8:39 AM   Teryl Lucy, MD Cell [phone] Pager [phone]

## 2012-03-12 NOTE — Discharge Summary (Signed)
Physician Discharge Summary  Patient ID: Todd Howell MRN: [id number] DOB/AGE: Nov 05, 1950 62 y.o.  Admit date: 03/03/2012 Discharge date: 03/12/2012  Admission Diagnoses:  Osteoarthritis of right knee  Discharge Diagnoses:  Principal Problem:   Osteoarthritis of right knee Active Problems:   Bladder cancer   Leg hematoma   Acute delirium   AKI (acute kidney injury)   Hyperlipidemia   Seizures   Asthma   Hypertension   High cholesterol   Acute blood loss anemia   Past Medical History  Diagnosis Date  . Bladder cancer     Dr. Annabell Howells, [facility]  . Hyperlipidemia   . Seizures   . Asthma     mainly as a child  . Hypertension     sees  dr.  Sharyn Lull  . High cholesterol   . Osteoarthritis of right knee 03/03/2012    Surgeries: Procedure(s): TOTAL KNEE ARTHROPLASTY on 03/03/2012   Consultants (if any):    Discharged Condition: Improved  Hospital Course: ABRHAM MASLOWSKI is an 62 y.o. male who was admitted 03/03/2012 with a diagnosis of Osteoarthritis of right knee and went to the operating room on 03/03/2012 and underwent the above named procedures.    He was given perioperative antibiotics:  Anti-infectives   Start     Dose/Rate Route Frequency Ordered Stop   03/10/12 0000  cephALEXin (KEFLEX) 500 MG capsule     500 mg Oral 4 times daily 03/10/12 1446     03/09/12 1200  vancomycin (VANCOCIN) 750 mg in sodium chloride 0.9 % 150 mL IVPB     750 mg 150 mL/hr over 60 Minutes Intravenous Every 12 hours 03/09/12 1101     03/07/12 1400  ceFAZolin (ANCEF) IVPB 1 g/50 mL premix     1 g 100 mL/hr over 30 Minutes Intravenous 3 times per day 03/07/12 1345 03/10/12 0559   03/03/12 2000  ceFAZolin (ANCEF) IVPB 2 g/50 mL premix     2 g 100 mL/hr over 30 Minutes Intravenous Every 6 hours 03/03/12 1806 03/04/12 0336   03/03/12 0600  ceFAZolin (ANCEF) IVPB 2 g/50 mL premix     2 g 100 mL/hr over 30 Minutes Intravenous On call to O.R. 03/02/12 1631 03/03/12 1335    .  He  was given sequential compression devices, early ambulation, and Lovenox bridging to Coumadin for DVT prophylaxis. In the early postoperative course he developed severe fracture blisters, with delamination of the skin, with serous weepage. This was cultured, and his knee was aspirated as well. He did have some early fevers. The cultures were all negative, and his fevers did resolve. We had him on perioperative Ancef, but when his skin and wound were threatening breakdown, we did have him restarted on Ancef for prophylaxis due to a very large hematoma around the knee, and Dr. Madelon Lips had switched him over to vancomycin. I am planning on discharging him with Keflex, for prophylaxis given his large hematoma and weeping fracture blisters. After he developed a very large hematoma, I discontinued his Lovenox and his Coumadin, and have transitioned him to aspirin. He use sequential compression devices for DVT prophylaxis. His fracture blisters were improving, and soft tissue trauma improving at the time of discharge. I suspect that his severe fracture blisters and soft tissue injury were secondary to a large correction of a preoperative flexion contracture of greater than 35, in combination with the aggressive anticoagulation using Lovenox bridging to Coumadin. He did develop some acute renal insufficiency, with a creatinine of almost  2, during his hospital stay. Internal medicine was consulted and assisted with the management.  He benefited maximally from the hospital stay.    Recent vital signs:  Filed Vitals:   03/12/12 0652  BP: 130/84  Pulse: 82  Temp: 98.9 F (37.2 C)  Resp: 20    Recent laboratory studies:  Lab Results  Component Value Date   HGB 8.2* 03/10/2012   HGB 8.8* 03/09/2012   HGB 8.9* 03/08/2012   Lab Results  Component Value Date   WBC 9.5 03/10/2012   PLT 203 03/10/2012   Lab Results  Component Value Date   INR 1.76* 03/10/2012   Lab Results  Component Value Date   NA 143 03/11/2012    K 4.2 03/11/2012   CL 107 03/11/2012   CO2 29 03/11/2012   BUN 40* 03/11/2012   CREATININE 1.45* 03/11/2012   GLUCOSE 89 03/11/2012    Discharge Medications:     Medication List    TAKE these medications       aspirin EC 325 MG tablet  Take 1 tablet (325 mg total) by mouth daily.     cephALEXin 500 MG capsule  Commonly known as:  KEFLEX  Take 1 capsule (500 mg total) by mouth 4 (four) times daily.     CRESTOR 20 MG tablet  Generic drug:  rosuvastatin  Take 20 mg by mouth at bedtime.     divalproex 500 MG 24 hr tablet  Commonly known as:  DEPAKOTE ER  Take 1,000 mg by mouth at bedtime.     methocarbamol 500 MG tablet  Commonly known as:  ROBAXIN  Take 1 tablet (500 mg total) by mouth 4 (four) times daily.     MICARDIS 40 MG tablet  Generic drug:  telmisartan  Take 40 mg by mouth every morning.     oxyCODONE-acetaminophen 10-325 MG per tablet  Commonly known as:  PERCOCET  Take 1-2 tablets by mouth every 6 (six) hours as needed for pain. MAXIMUM TOTAL ACETAMINOPHEN DOSE IS 4000 MG PER DAY     promethazine 25 MG tablet  Commonly known as:  PHENERGAN  Take 1 tablet (25 mg total) by mouth every 6 (six) hours as needed for nausea.     sennosides-docusate sodium 8.6-50 MG tablet  Commonly known as:  SENOKOT-S  Take 1 tablet by mouth daily.        Diagnostic Studies: Dg Chest 2 View  02/27/2012  *RADIOLOGY REPORT*  Clinical Data: Preop for knee arthroplasty  CHEST - 2 VIEW  Comparison: 07/25/2009  Findings: Cardiomediastinal silhouette is stable.  No acute infiltrate or pleural effusion.  No pulmonary edema.  Mild degenerative changes lower thoracic spine.  IMPRESSION: No active disease.  No significant change.   Original Report Authenticated By: Natasha Mead, M.D.    Dg Knee Right Port  03/03/2012  *RADIOLOGY REPORT*  Clinical Data: Status post right total knee arthroplasty.  PORTABLE RIGHT KNEE - 1-2 VIEW  Comparison: No priors.  Findings: AP and lateral views of the right knee  demonstrate postoperative changes of right total knee arthroplasty.  The femoral and tibial components of the prosthesis appear to be well seated without periprosthetic fracture or other immediate complicating features.  Extensive gas is noted in the joint space and the overlying soft tissues.  IMPRESSION: 1.  Postoperative changes of right total knee arthroplasty without immediate complicating features, as above.   Original Report Authenticated By: Trudie Reed, M.D.     Disposition:  Discharge Orders   Future Orders Complete By Expires     Call MD / Call 911  As directed     Comments:      If you experience chest pain or shortness of breath, CALL 911 and be transported to the hospital emergency room.  If you develope a fever above 101 F, pus (white drainage) or increased drainage or redness at the wound, or calf pain, call your surgeon's office.    Constipation Prevention  As directed     Comments:      Drink plenty of fluids.  Prune juice may be helpful.  You may use a stool softener, such as Colace (over the counter) 100 mg twice a day.  Use MiraLax (over the counter) for constipation as needed.    Diet general  As directed     Discharge instructions  As directed     Comments:      Change dressing in 3 days and reapply fresh dressing, unless you have a splint (half cast).  If you have a splint/cast, just leave in place until your follow-up appointment.    Keep wounds dry for 3 weeks.  Leave steri-strips in place on skin.  Do not apply lotion or anything to the wound.    Weight bearing as tolerated  As directed        Follow-up Information   Follow up with Eulas Post, MD. Schedule an appointment as soon as possible for a visit in 2 weeks.   Contact information:   [address] [place] [address] [phone]       Follow up with [facility]. [facility] RN, Physical Therapy, Occupational Therapy)    Contact information:   [phone]        Signed: Eulas Post 03/12/2012, 8:24 AM

## 2012-03-12 NOTE — Progress Notes (Signed)
Physical Therapy Treatment Patient Details Name: Todd Howell MRN: [id number] DOB: 1950/08/01 Today's Date: 03/12/2012 Time: 5284-1324 PT Time Calculation (min): 24 min  PT Assessment / Plan / Recommendation Comments on Treatment Session  Pt presents with R TKA. Pt states he is ready to go home today with wife. Pt moving well with pain 6/10 initially and reduced to 5/10 with moving. pt ready to D/C home with HHPT and wife assistance from PT stand point.     Follow Up Recommendations  Home health PT;Supervision - Intermittent     Does the patient have the potential to tolerate intense rehabilitation     Barriers to Discharge        Equipment Recommendations  Rolling walker with 5" wheels    Recommendations for Other Services    Frequency 7X/week   Plan Discharge plan remains appropriate;Frequency remains appropriate    Precautions / Restrictions Precautions Precautions: Fall;Knee Restrictions Weight Bearing Restrictions: Yes RLE Weight Bearing: Weight bearing as tolerated   Pertinent Vitals/Pain 5-6/10.  Premedicated.      Mobility  Bed Mobility Bed Mobility: Supine to Sit;Sitting - Scoot to Edge of Bed Supine to Sit: 4: Min assist Sitting - Scoot to Edge of Bed: 6: Modified independent (Device/Increase time) Sit to Supine: With rail;4: Min assist;HOB flat Details for Bed Mobility Assistance: min A to advance R LE to EOB. Transfers Transfers: Sit to Stand;Stand to Sit Sit to Stand: 5: Supervision;With upper extremity assist;With armrests;From bed Stand to Sit: 4: Min guard;With upper extremity assist;To chair/3-in-1;With armrests Details for Transfer Assistance: cues for hand placement  Ambulation/Gait Ambulation/Gait Assistance: 5: Supervision Ambulation Distance (Feet): 200 Feet Assistive device: Rolling walker Ambulation/Gait Assistance Details: cues for upright posture, gait sequencing and safety with RW. Gait Pattern: Step-to pattern;Decreased step length -  left;Decreased stance time - right;Trunk flexed Stairs: Yes Stairs Assistance: 4: Min guard Stairs Assistance Details (indicate cue type and reason): cues for safety with RW Stair Management Technique: Backwards;With walker Number of Stairs: 1 Wheelchair Mobility Wheelchair Mobility: No    Exercises     PT Diagnosis:    PT Problem List:   PT Treatment Interventions:     PT Goals Acute Rehab PT Goals PT Goal Formulation: With patient Time For Goal Achievement: 03/18/12 Potential to Achieve Goals: Good Pt will go Supine/Side to Sit: with modified independence PT Goal: Supine/Side to Sit - Progress: Goal set today Pt will go Sit to Supine/Side: with modified independence PT Goal: Sit to Supine/Side - Progress: Goal set today Pt will go Sit to Stand: with modified independence PT Goal: Sit to Stand - Progress: Goal set today Pt will go Stand to Sit: with modified independence PT Goal: Stand to Sit - Progress: Goal set today Pt will Ambulate: >150 feet;with modified independence;with rolling walker PT Goal: Ambulate - Progress: Goal set today Pt will Go Up / Down Stairs: 1-2 stairs;with modified independence;with rolling walker PT Goal: Up/Down Stairs - Progress: Goal set today Pt will Perform Home Exercise Program: with supervision, verbal cues required/provided PT Goal: Perform Home Exercise Program - Progress: Goal set today  Visit Information  Last PT Received On: 03/12/12 Assistance Needed: +1    Subjective Data  Subjective: "Ready to go home"   Cognition  Cognition Overall Cognitive Status: Appears within functional limits for tasks assessed/performed Arousal/Alertness: Awake/alert Orientation Level: Appears intact for tasks assessed Behavior During Session: [facility] for tasks performed    Balance  Balance Balance Assessed: No  End of Session PT -  End of Session Equipment Utilized During Treatment: Gait belt Activity Tolerance: Patient tolerated treatment  well Patient left: in chair;with call bell/phone within reach Nurse Communication: Mobility status   GP    Goodrich, [place], [place] [phone] [name], [place] [phone] 03/12/2012, 10:24 AM

## 2012-03-12 NOTE — Progress Notes (Signed)
Occupational Therapy Treatment Patient Details Name: Todd Howell MRN: [id number] DOB: 1950-03-22 Today's Date: 03/12/2012 Time: 8413-2440 OT Time Calculation (min): 19 min  OT Assessment / Plan / Recommendation Comments on Treatment Session Pt making progress and doing well. Pt should continue with OT services to maximize level of function to return home safely    Follow Up Recommendations  Home health OT    Barriers to Discharge   None    Equipment Recommendations       Recommendations for Other Services    Frequency Min 2X/week   Plan Discharge plan remains appropriate    Precautions / Restrictions Precautions Precautions: Fall;Knee Required Braces or Orthoses: Knee Immobilizer - Right Knee Immobilizer - Right: Discontinue post op day 2 Restrictions Weight Bearing Restrictions: Yes RLE Weight Bearing: Weight bearing as tolerated   Pertinent Vitals/Pain     ADL  Lower Body Bathing: Simulated;Supervision/safety;Set up;Minimal assistance Where Assessed - Lower Body Bathing: Unsupported sitting Lower Body Dressing: Performed;Supervision/safety;Set up;Minimal assistance Where Assessed - Lower Body Dressing: Unsupported sitting ADL Comments: pt requested review of ADL A/E for Lb dressing, pt able to return demo    OT Diagnosis:    OT Problem List:   OT Treatment Interventions:     OT Goals ADL Goals ADL Goal: Lower Body Bathing - Progress: Progressing toward goals ADL Goal: Lower Body Dressing - Progress: Progressing toward goals  Visit Information  Last OT Received On: 03/12/12    Subjective Data  Subjective: " I am goinhome tomorrow, finally " Patient Stated Goal: To return home   Prior Functioning       Cognition  Cognition Overall Cognitive Status: Appears within functional limits for tasks assessed/performed Arousal/Alertness: Awake/alert Orientation Level: Appears intact for tasks assessed Behavior During Session: [facility] for tasks performed     Mobility       Exercises      Balance Balance Balance Assessed: No   End of Session OT - End of Session Activity Tolerance: Patient tolerated treatment well Patient left: in chair;with call bell/phone within reach;with family/visitor present  GO     Galen Manila 03/12/2012, 2:26 PM

## 2012-03-14 LAB — ANAEROBIC CULTURE

## 2012-03-14 LAB — CULTURE, BLOOD (ROUTINE X 2): Culture: NO GROWTH

## 2012-03-16 LAB — CULTURE, BLOOD (ROUTINE X 2)

## 2012-05-06 ENCOUNTER — Other Ambulatory Visit: Payer: Self-pay | Admitting: Diagnostic Neuroimaging

## 2012-07-18 ENCOUNTER — Telehealth: Payer: Self-pay | Admitting: Diagnostic Neuroimaging

## 2012-07-18 MED ORDER — DIVALPROEX SODIUM ER 500 MG PO TB24: 1000.0000 mg | ORAL_TABLET | Freq: Every day | ORAL | Status: DC

## 2012-07-18 NOTE — Telephone Encounter (Signed)
Rx Sent  

## 2012-08-12 ENCOUNTER — Ambulatory Visit: Payer: Self-pay | Admitting: Nurse Practitioner

## 2012-10-22 ENCOUNTER — Other Ambulatory Visit: Payer: Self-pay | Admitting: Diagnostic Neuroimaging

## 2012-11-11 ENCOUNTER — Ambulatory Visit: Payer: Self-pay | Admitting: Nurse Practitioner

## 2012-11-13 ENCOUNTER — Other Ambulatory Visit: Payer: Self-pay

## 2012-11-17 ENCOUNTER — Ambulatory Visit (INDEPENDENT_AMBULATORY_CARE_PROVIDER_SITE_OTHER): Payer: Medicare Other | Admitting: Nurse Practitioner

## 2012-11-17 ENCOUNTER — Encounter: Payer: Self-pay | Admitting: Nurse Practitioner

## 2012-11-17 VITALS — BP 125/82 | HR 69 | Ht 69.75 in | Wt 195.0 lb

## 2012-11-17 DIAGNOSIS — G40309 Generalized idiopathic epilepsy and epileptic syndromes, not intractable, without status epilepticus: Secondary | ICD-10-CM

## 2012-11-17 DIAGNOSIS — Z79899 Other long term (current) drug therapy: Secondary | ICD-10-CM

## 2012-11-17 MED ORDER — DIVALPROEX SODIUM 500 MG PO DR TAB: DELAYED_RELEASE_TABLET | ORAL | Status: DC

## 2012-11-17 NOTE — Patient Instructions (Signed)
Check labs today Continue Depakote at current dose, we will refill Followup yearly when necessary
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
## 2012-11-18 ENCOUNTER — Telehealth: Payer: Self-pay | Admitting: Nurse Practitioner

## 2012-11-18 DIAGNOSIS — Z79899 Other long term (current) drug therapy: Secondary | ICD-10-CM

## 2012-11-18 LAB — COMPREHENSIVE METABOLIC PANEL
ALT: 11 IU/L (ref 0–44)
Albumin: 3.7 g/dL (ref 3.6–4.8)
Alkaline Phosphatase: 62 IU/L (ref 39–117)
BUN: 17 mg/dL (ref 8–27)
CO2: 26 mmol/L (ref 18–29)
Chloride: 104 mmol/L (ref 96–108)
Glucose: 74 mg/dL (ref 65–99)
Potassium: 4.4 mmol/L (ref 3.5–5.2)
Total Protein: 6.7 g/dL (ref 6.0–8.5)

## 2012-11-18 LAB — CBC WITH DIFFERENTIAL/PLATELET
Basophils Absolute: 0.1 10*3/uL (ref 0.0–0.2)
Eosinophils Absolute: 0.3 10*3/uL (ref 0.0–0.4)
MCH: 32 pg (ref 26.6–33.0)
MCHC: 34.2 g/dL (ref 31.5–35.7)
MCV: 94 fL (ref 79–97)
Monocytes Absolute: 1.7 10*3/uL — ABNORMAL HIGH (ref 0.1–0.9)
Neutrophils Relative %: 52 %
RDW: 14.1 % (ref 12.3–15.4)
WBC: 9.6 10*3/uL (ref 3.4–10.8)

## 2012-11-18 LAB — VALPROIC ACID LEVEL: Valproic Acid Lvl: 23 ug/mL — ABNORMAL LOW (ref 50–100)

## 2012-11-18 MED ORDER — DIVALPROEX SODIUM ER 500 MG PO TB24: 500.0000 mg | ORAL_TABLET | Freq: Every day | ORAL | Status: DC

## 2012-11-18 NOTE — Telephone Encounter (Signed)
Left message that Depakote level is too low. Will need to increase dose. Call to home # and line busy.

## 2012-11-18 NOTE — Telephone Encounter (Signed)
Called and spoke with Patient. Will increase Depakote to 3 tabs at night and repeat level in 10 days. He is agreeable.

## 2012-12-08 ENCOUNTER — Other Ambulatory Visit: Payer: Self-pay

## 2012-12-08 ENCOUNTER — Telehealth: Payer: Self-pay | Admitting: Nurse Practitioner

## 2012-12-08 DIAGNOSIS — R569 Unspecified convulsions: Secondary | ICD-10-CM

## 2012-12-08 NOTE — Telephone Encounter (Signed)
Called patient, no answer and could not leave VM message

## 2012-12-08 NOTE — Telephone Encounter (Signed)
I called and spoke with patient. I let him know Ms. Daphine Deutscher, NP would like him to come in this week to have a new depakote level drawn. Patient stated that he will do this.

## 2012-12-08 NOTE — Telephone Encounter (Signed)
I do not see that patient had repeat depakote level done after dose change. This is to ensure the dose is correct. Please obtain within the next week. Please call patient

## 2012-12-10 ENCOUNTER — Other Ambulatory Visit (INDEPENDENT_AMBULATORY_CARE_PROVIDER_SITE_OTHER): Payer: Self-pay

## 2012-12-10 ENCOUNTER — Other Ambulatory Visit: Payer: Self-pay | Admitting: *Deleted

## 2012-12-10 DIAGNOSIS — G40909 Epilepsy, unspecified, not intractable, without status epilepticus: Secondary | ICD-10-CM

## 2012-12-10 DIAGNOSIS — Z0289 Encounter for other administrative examinations: Secondary | ICD-10-CM

## 2012-12-10 NOTE — Addendum Note (Signed)
Addended byHermenia Fiscal on: 12/10/2012 03:20 PM   Modules accepted: Orders

## 2012-12-11 LAB — VALPROIC ACID LEVEL: Valproic Acid Lvl: 72 ug/mL (ref 50–100)

## 2012-12-11 NOTE — Progress Notes (Signed)
Quick Note:  Shared lab results with patient per Ms Martin's findings, patient verbalized understanding ______

## 2012-12-18 NOTE — Progress Notes (Signed)
I reviewed note and agree with plan.   Tahira Olivarez R. Felice Hope, MD  Certified in Neurology, Neurophysiology and Neuroimaging  [facility] [address], [address] [place], [place] [address] [phone]   

## 2013-11-09 ENCOUNTER — Telehealth: Payer: Self-pay | Admitting: *Deleted

## 2013-11-09 NOTE — Telephone Encounter (Signed)
Spoke to wife and patient out of town. Patient needed to be r/s due to cm admin time. Wife will have patient call office to reschedule once he returns from out of town.

## 2013-11-17 ENCOUNTER — Ambulatory Visit: Payer: Medicare Other | Admitting: Nurse Practitioner

## 2013-12-03 ENCOUNTER — Other Ambulatory Visit: Payer: Self-pay | Admitting: Nurse Practitioner

## 2013-12-06 ENCOUNTER — Other Ambulatory Visit: Payer: Self-pay

## 2013-12-06 MED ORDER — DIVALPROEX SODIUM ER 500 MG PO TB24: ORAL_TABLET | ORAL | Status: DC

## 2013-12-07 ENCOUNTER — Other Ambulatory Visit: Payer: Self-pay

## 2013-12-07 MED ORDER — DIVALPROEX SODIUM ER 500 MG PO TB24: ORAL_TABLET | ORAL | Status: DC

## 2013-12-07 NOTE — Telephone Encounter (Signed)
Two previous attempts to send this Rx = Tramsmission failed

## 2014-01-13 ENCOUNTER — Telehealth: Payer: Self-pay | Admitting: Nurse Practitioner

## 2014-01-13 NOTE — Telephone Encounter (Signed)
Needs medical records sent to him, has relocated to [place], needs release paper work mailed to him.

## 2014-02-20 ENCOUNTER — Other Ambulatory Visit: Payer: Self-pay | Admitting: Diagnostic Neuroimaging

## 2014-02-26 IMAGING — CR DG CHEST 2V
2 series · 2 of 2 positions shown · non-contrast
Comparison: 07/25/2009

CLINICAL DATA: Preop for knee arthroplasty

CHEST - 2 VIEW

[view not recorded (1 of 2)]
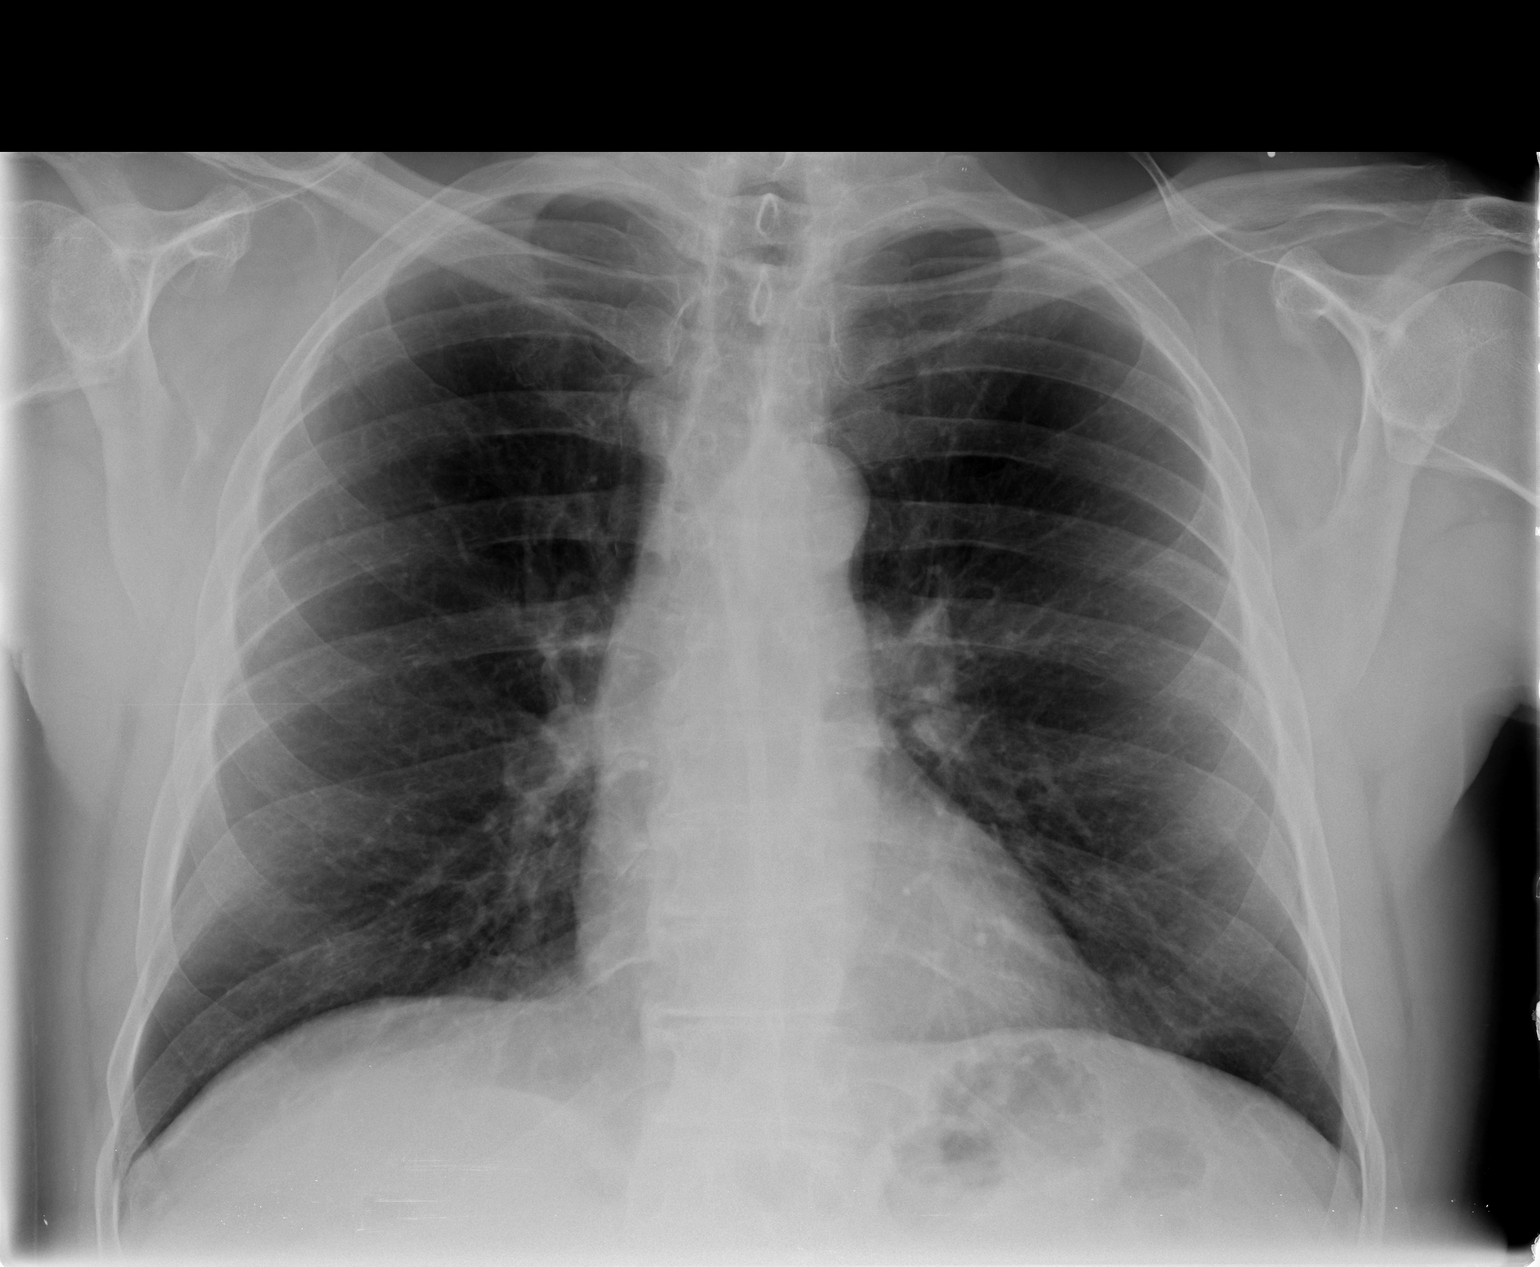

[view not recorded (2 of 2)]
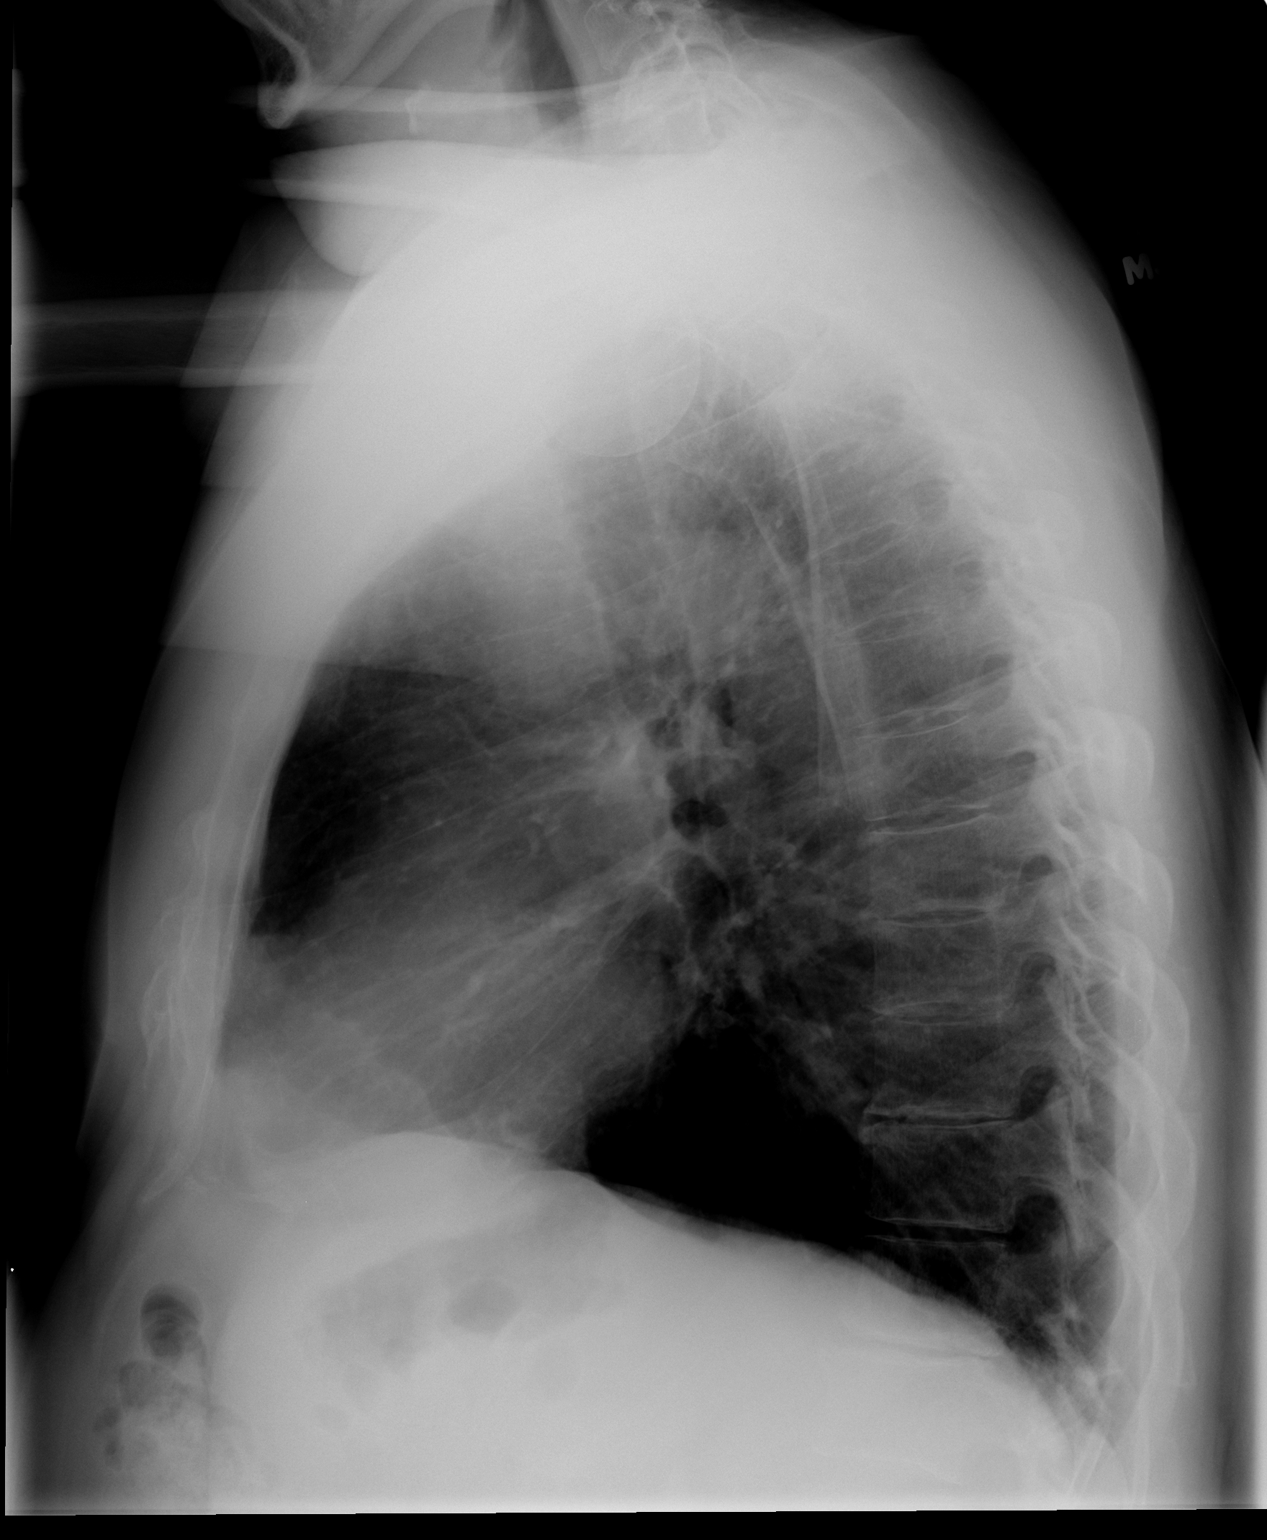

[2 of 2 positions shown; findings below may reference images not displayed]

FINDINGS: Cardiomediastinal silhouette is stable.  No acute
infiltrate or pleural effusion.  No pulmonary edema.  Mild
degenerative changes lower thoracic spine.
IMPRESSION: No active disease.  No significant change.

## 2014-06-02 ENCOUNTER — Telehealth: Payer: Self-pay | Admitting: Nurse Practitioner

## 2014-06-02 NOTE — Telephone Encounter (Signed)
Pt called and stated that he has moved out of state and been referred to someone closer to him, the earlier appt they had was in June so therefor he is about to run out of medication and needs enough to last him until his appt. He needs a refill on Rx. divalproex (DEPAKOTE ER) 500 MG 24 hr tablet. Please call and advise.

## 2014-06-02 NOTE — Telephone Encounter (Signed)
We have not seen this patient since 2014, and have not sent a refill since 2015.  I called back.  Spoke with patient at great length.  Says he has been living in [place] for almost a year and has been getting this Rx by visiting different ER's.  Says they will no longer provide Rx's for him, and referred him to a Neurologist in [place].  They are unable to see him until June or July.  Patient has seen a provider in [place] (PCP).  I recommended he contact them and ask for a refill on this med.  Patient was agreeable to this and will contact MD there.  He will call us back if anything further is needed.

## 2015-04-11 ENCOUNTER — Ambulatory Visit (INDEPENDENT_AMBULATORY_CARE_PROVIDER_SITE_OTHER): Payer: Medicare Other | Admitting: Diagnostic Neuroimaging

## 2015-04-11 ENCOUNTER — Encounter: Payer: Self-pay | Admitting: Diagnostic Neuroimaging

## 2015-04-11 VITALS — BP 118/72 | HR 67 | Ht 69.0 in | Wt 201.0 lb

## 2015-04-11 DIAGNOSIS — G40309 Generalized idiopathic epilepsy and epileptic syndromes, not intractable, without status epilepticus: Secondary | ICD-10-CM | POA: Diagnosis not present

## 2015-04-11 DIAGNOSIS — G4700 Insomnia, unspecified: Secondary | ICD-10-CM

## 2015-04-11 MED ORDER — DIVALPROEX SODIUM ER 500 MG PO TB24: 1500.0000 mg | ORAL_TABLET | Freq: Every day | ORAL | Status: DC

## 2015-04-11 NOTE — Patient Instructions (Signed)
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
## 2016-03-14 ENCOUNTER — Encounter: Payer: Self-pay | Admitting: Family Medicine

## 2016-03-14 ENCOUNTER — Ambulatory Visit (INDEPENDENT_AMBULATORY_CARE_PROVIDER_SITE_OTHER): Payer: Medicare Other | Admitting: Family Medicine

## 2016-03-14 ENCOUNTER — Encounter: Payer: Self-pay | Admitting: Diagnostic Neuroimaging

## 2016-03-14 VITALS — BP 110/76 | HR 71 | Temp 98.0°F | Ht 66.54 in | Wt 184.0 lb

## 2016-03-14 DIAGNOSIS — G40309 Generalized idiopathic epilepsy and epileptic syndromes, not intractable, without status epilepticus: Secondary | ICD-10-CM | POA: Diagnosis not present

## 2016-03-14 DIAGNOSIS — G4700 Insomnia, unspecified: Secondary | ICD-10-CM | POA: Diagnosis not present

## 2016-03-14 DIAGNOSIS — E7800 Pure hypercholesterolemia, unspecified: Secondary | ICD-10-CM | POA: Diagnosis not present

## 2016-03-14 DIAGNOSIS — I10 Essential (primary) hypertension: Secondary | ICD-10-CM | POA: Diagnosis not present

## 2016-03-14 DIAGNOSIS — N529 Male erectile dysfunction, unspecified: Secondary | ICD-10-CM

## 2016-03-14 DIAGNOSIS — Z23 Encounter for immunization: Secondary | ICD-10-CM | POA: Diagnosis not present

## 2016-03-14 MED ORDER — ZOLPIDEM TARTRATE 5 MG PO TABS: 5.0000 mg | ORAL_TABLET | Freq: Every evening | ORAL | 2 refills | Status: DC | PRN

## 2016-03-14 MED ORDER — TADALAFIL 20 MG PO TABS: 20.0000 mg | ORAL_TABLET | ORAL | 11 refills | Status: DC | PRN

## 2016-03-14 NOTE — Patient Instructions (Signed)
I will notify you of results via mychart

## 2016-03-14 NOTE — Progress Notes (Signed)
Subjective:    Patient ID: Todd Howell, male    DOB: Jun 20, 1950, 66 y.o.   MRN: [id number]  HPI This is a 66 yo male who presents today to establish care. Goes to gym 4-5 times a week and watches diet. Is retired. In relationship. Was married for 35 years. Has 2 sons, they live in [place]. Has some family stress and stress with girlfriend and going back and forth from [place] to [place].    History of bladder cancer- Has not followed up with Dr. Jeffie Pollock for bladder cancer in over a year, plans to make an upcoming appointment.  Insomnia- Longstanding. Takes ambien to help with sleep, especially when traveling.   ED- request refill of Cialis, has tried Viagra and Cialis in past prn, Cialis works better for him.   Seizure disorder- no seizure in many years, has been maintained on depakote, has appointment with neurology scheduled. Needs valproic acid level  HTN- has been well controlled on micardis 40 mg, no side effects  Hyperlipidemia- currently on Crestor, has been watching diet and increasing exercise, would like to come off Crestor.  Past Medical History:  Diagnosis Date  . Asthma    mainly as a child  . Bladder cancer [facility])    Dr. Jeffie Pollock, [facility]  . High cholesterol   . Hyperlipidemia   . Hypertension    sees  dr.  Terrence Dupont  . Osteoarthritis of right knee 03/03/2012  . Seizures ([place])    Past Surgical History:  Procedure Laterality Date  . ANAL FISSURE REPAIR  09/20/2011   Lat sphincterotomy  . BLADDER SURGERY  02/2005   TURBT   . HEMORRHOID SURGERY  09/20/2011   Int hemorrhoidectomy x 2  . ROTATOR CUFF REPAIR  07/2009   right  . TOTAL KNEE ARTHROPLASTY Right 03/03/2012   Dr Mardelle Matte  . TOTAL KNEE ARTHROPLASTY Right 03/03/2012   Procedure: TOTAL KNEE ARTHROPLASTY;  Surgeon: Johnny Bridge, MD;  Location: [place];  Service: Orthopedics;  Laterality: Right;   Family History  Problem Relation Age of Onset  . Cancer Mother     colon  . Cancer Father     colon/lung   Social  History  Substance Use Topics  . Smoking status: Former Smoker    Packs/day: 1.00    Years: 30.00    Types: Cigarettes    Quit date: 07/02/1986  . Smokeless tobacco: Never Used  . Alcohol use 1.8 oz/week    1 Glasses of wine, 2 Cans of beer per week     Comment: occasionally      Review of Systems  Constitutional: Negative for fatigue, fever and unexpected weight change.  Respiratory: Negative for cough, shortness of breath and wheezing.   Cardiovascular: Negative for chest pain, palpitations and leg swelling.  Gastrointestinal: Negative for abdominal pain, blood in stool, constipation and diarrhea.  Genitourinary: Negative for difficulty urinating, dysuria and hematuria.  Musculoskeletal: Negative for arthralgias and myalgias.  Neurological: Negative for headaches.  Psychiatric/Behavioral: Positive for sleep disturbance. Negative for dysphoric mood. The patient is nervous/anxious.        Objective:   Physical Exam  Constitutional: He is oriented to person, place, and time. He appears well-developed and well-nourished. No distress.  HENT:  Head: Normocephalic and atraumatic.  Mouth/Throat: Oropharynx is clear and moist.  Eyes: Conjunctivae are normal.  Neck: Normal range of motion. Neck supple.  Cardiovascular: Normal rate, regular rhythm and normal heart sounds.   Pulmonary/Chest: Effort normal and breath sounds  normal.  Abdominal: Soft. Bowel sounds are normal. There is no tenderness. There is no rebound and no guarding.  Neurological: He is alert and oriented to person, place, and time.  Skin: Skin is warm and dry. He is not diaphoretic.  Psychiatric: He has a normal mood and affect. His behavior is normal. Judgment and thought content normal.  Vitals reviewed.     BP 110/76 (BP Location: Right Arm, Patient Position: Sitting, Cuff Size: Normal)   Pulse 71   Temp 98 F (36.7 C) (Oral)   Ht 5' 6.53" (1.69 m)   Wt 184 lb (83.5 kg)   SpO2 96%   BMI 29.22 kg/m  Wt  Readings from Last 3 Encounters:  03/14/16 184 lb (83.5 kg)  04/11/15 201 lb (91.2 kg)  11/17/12 195 lb (88.5 kg)   Depression screen PHQ 2/9 03/14/2016  Decreased Interest 0  Down, Depressed, Hopeless 0  PHQ - 2 Score 0       Assessment & Plan:  1. Erectile dysfunction, unspecified erectile dysfunction type - tadalafil (CIALIS) 20 MG tablet; Take 1 tablet (20 mg total) by mouth every other day as needed for erectile dysfunction.  Dispense: 10 tablet; Refill: 11  2. Essential hypertension - CBC with Differential/Platelet - Comprehensive metabolic panel  3. Insomnia, unspecified type - zolpidem (AMBIEN) 5 MG tablet; Take 1 tablet (5 mg total) by mouth at bedtime as needed for sleep.  Dispense: 30 tablet; Refill: 2  4. Generalized tonic clonic epilepsy ([facility]) - Valproic acid level - follow up with neurology as scheduled  5. Hypercholesteremia - CBC with Differential/Platelet - Comprehensive metabolic panel - Lipid panel  6. Need for Tdap vaccination - Tdap vaccine greater than or equal to 7yo IM  7. Need for pneumococcal vaccination - Pneumococcal conjugate vaccine 13-valent IM  - follow up in 6 months  Clarene Reamer, FNP-BC  [facility] [place]  03/16/2016 9:36 PM

## 2016-03-14 NOTE — Progress Notes (Signed)
Pre visit review using our clinic review tool, if applicable. No additional management support is needed unless otherwise documented below in the visit note. 

## 2016-03-15 LAB — COMPREHENSIVE METABOLIC PANEL
ALBUMIN: 4.1 g/dL (ref 3.5–5.2)
ALK PHOS: 48 U/L (ref 39–117)
ALT: 19 U/L (ref 0–53)
AST: 30 U/L (ref 0–37)
BUN: 26 mg/dL — ABNORMAL HIGH (ref 6–23)
CALCIUM: 10.2 mg/dL (ref 8.4–10.5)
CO2: 30 mEq/L (ref 19–32)
Chloride: 102 mEq/L (ref 96–112)
Creatinine, Ser: 1.04 mg/dL (ref 0.40–1.50)
GFR: 75.85 mL/min (ref >60.00)
Glucose, Bld: 90 mg/dL (ref 70–99)
POTASSIUM: 4.3 meq/L (ref 3.5–5.1)
Sodium: 139 mEq/L (ref 135–145)
TOTAL PROTEIN: 7.7 g/dL (ref 6.0–8.3)
Total Bilirubin: 0.5 mg/dL (ref 0.2–1.2)

## 2016-03-15 LAB — CBC WITH DIFFERENTIAL/PLATELET
Basophils Absolute: 0 10*3/uL (ref 0.0–0.1)
Basophils Relative: 0.4 % (ref 0.0–3.0)
Eosinophils Absolute: 0.1 10*3/uL (ref 0.0–0.7)
Eosinophils Relative: 1.7 % (ref 0.0–5.0)
HCT: 47.5 % (ref 39.0–52.0)
Hemoglobin: 16.2 g/dL (ref 13.0–17.0)
LYMPHS ABS: 2.4 10*3/uL (ref 0.7–4.0)
Lymphocytes Relative: 31.6 % (ref 12.0–46.0)
MCHC: 34.2 g/dL (ref 30.0–36.0)
MCV: 93.7 fl (ref 78.0–100.0)
Monocytes Absolute: 1.1 10*3/uL — ABNORMAL HIGH (ref 0.1–1.0)
Monocytes Relative: 15 % — ABNORMAL HIGH (ref 3.0–12.0)
NEUTROS PCT: 51.3 % (ref 43.0–77.0)
Neutro Abs: 3.9 10*3/uL (ref 1.4–7.7)
Platelets: 160 10*3/uL (ref 150.0–400.0)
RBC: 5.07 Mil/uL (ref 4.22–5.81)
RDW: 13.5 % (ref 11.5–15.5)
WBC: 7.6 10*3/uL (ref 4.0–10.5)

## 2016-03-15 LAB — LIPID PANEL
CHOLESTEROL: 114 mg/dL (ref 0–200)
HDL: 51.4 mg/dL (ref >39.00)
LDL Cholesterol: 51 mg/dL (ref 0–99)
NonHDL: 62.7
TRIGLYCERIDES: 58 mg/dL (ref 0.0–149.0)
Total CHOL/HDL Ratio: 2
VLDL: 11.6 mg/dL (ref 0.0–40.0)

## 2016-03-15 LAB — VALPROIC ACID LEVEL: VALPROIC ACID LVL: 66.1 ug/mL (ref 50.0–100.0)

## 2016-04-10 ENCOUNTER — Ambulatory Visit: Payer: Medicare Other | Admitting: Diagnostic Neuroimaging

## 2016-06-13 ENCOUNTER — Other Ambulatory Visit: Payer: Self-pay | Admitting: Diagnostic Neuroimaging

## 2016-07-04 ENCOUNTER — Telehealth: Payer: Self-pay | Admitting: Family Medicine

## 2016-07-04 NOTE — Telephone Encounter (Signed)
**  Remind patient they can make refill requests via MyChart**  Medication refill request (Name & Dosage):  MICARDIS 40 MG tablet  Preferred pharmacy (Name & Address):  [facility] - [place], [address] [phone] (Phone) [phone] (Fax)    Other comments (if applicable):   Notify patient once rx has been placed. Patient usually gets it sent to [facility] however, only has 2 pills left and can not wait. Send to the [facility] and maybe a 30 day supply.

## 2016-07-05 ENCOUNTER — Other Ambulatory Visit: Payer: Self-pay | Admitting: Emergency Medicine

## 2016-07-05 MED ORDER — TELMISARTAN 40 MG PO TABS: 40.0000 mg | ORAL_TABLET | Freq: Every morning | ORAL | 1 refills | Status: DC

## 2016-07-05 NOTE — Telephone Encounter (Signed)
Medication sent to pharmacy. Nothing further needed at this time.

## 2016-08-16 ENCOUNTER — Telehealth: Payer: Self-pay | Admitting: Family Medicine

## 2016-08-16 NOTE — Telephone Encounter (Signed)
Left pt message asking to call Allison back directly at [phone] to schedule AWV. Thanks! ° °*NOTE* Never had AWV before °

## 2016-09-14 ENCOUNTER — Other Ambulatory Visit: Payer: Self-pay | Admitting: Diagnostic Neuroimaging

## 2016-09-19 ENCOUNTER — Ambulatory Visit (INDEPENDENT_AMBULATORY_CARE_PROVIDER_SITE_OTHER): Payer: Medicare Other | Admitting: *Deleted

## 2016-09-19 ENCOUNTER — Encounter: Payer: Self-pay | Admitting: *Deleted

## 2016-09-19 ENCOUNTER — Telehealth: Payer: Self-pay | Admitting: *Deleted

## 2016-09-19 ENCOUNTER — Ambulatory Visit: Payer: Medicare Other | Admitting: Family Medicine

## 2016-09-19 VITALS — BP 108/68 | HR 61 | Resp 16 | Ht 67.0 in | Wt 182.0 lb

## 2016-09-19 DIAGNOSIS — G4700 Insomnia, unspecified: Secondary | ICD-10-CM | POA: Diagnosis not present

## 2016-09-19 DIAGNOSIS — Z0000 Encounter for general adult medical examination without abnormal findings: Secondary | ICD-10-CM | POA: Diagnosis not present

## 2016-09-19 MED ORDER — ZOLPIDEM TARTRATE 5 MG PO TABS: 5.0000 mg | ORAL_TABLET | Freq: Every evening | ORAL | 2 refills | Status: DC | PRN

## 2016-09-19 NOTE — Patient Instructions (Addendum)
Todd Howell , Thank you for taking time to come for your Medicare Wellness Visit. I appreciate your ongoing commitment to your health goals. Please review the following plan we discussed and let me know if I can assist you in the future.   These are the goals we discussed: Goals    . Maintain weight and get physically stronger       This is a list of the screening recommended for you and due dates:  Health Maintenance  Topic Date Due  .  Hepatitis C: One time screening is recommended by Center for Disease Control  (CDC) for  adults born from [birth year] through 1966.   [birth year]  . Flu Shot  01/08/2017*  . Pneumonia vaccines (2 of 2 - PPSV23) 03/14/2017  . Colon Cancer Screening  11/07/2025  . Tetanus Vaccine  03/15/2026  *Topic was postponed. The date shown is not the original due date.   Preventive Care for Adults  A healthy lifestyle and preventive care can promote health and wellness. Preventive health guidelines for adults include the following key practices.  . A routine yearly physical is a good way to check with your health care provider about your health and preventive screening. It is a chance to share any concerns and updates on your health and to receive a thorough exam.  . Visit your dentist for a routine exam and preventive care every 6 months. Brush your teeth twice a day and floss once a day. Good oral hygiene prevents tooth decay and gum disease.  . The frequency of eye exams is based on your age, health, family medical history, use  of contact lenses, and other factors. Follow your health care provider's ecommendations for frequency of eye exams.  . Eat a healthy diet. Foods like vegetables, fruits, whole grains, low-fat dairy products, and lean protein foods contain the nutrients you need without too many calories. Decrease your intake of foods high in solid fats, added sugars, and salt. Eat the right amount of calories for you. Get information about a proper diet from your  health care provider, if necessary.  . Regular physical exercise is one of the most important things you can do for your health. Most adults should get at least 150 minutes of moderate-intensity exercise (any activity that increases your heart rate and causes you to sweat) each week. In addition, most adults need muscle-strengthening exercises on 2 or more days a week.  Silver Sneakers may be a benefit available to you. To determine eligibility, you may visit the website: www.silversneakers.com or contact program at 530-066-0305 Mon-Fri between 8AM-8PM.   . Maintain a healthy weight. The body mass index (BMI) is a screening tool to identify possible weight problems. It provides an estimate of body fat based on height and weight. Your health care provider can find your BMI and can help you achieve or maintain a healthy weight.   For adults 20 years and older: ? A BMI below 18.5 is considered underweight. ? A BMI of 18.5 to 24.9 is normal. ? A BMI of 25 to 29.9 is considered overweight. ? A BMI of 30 and above is considered obese.   . Maintain normal blood lipids and cholesterol levels by exercising and minimizing your intake of saturated fat. Eat a balanced diet with plenty of fruit and vegetables. Blood tests for lipids and cholesterol should begin at age 24 and be repeated every 5 years. If your lipid or cholesterol levels are high, you are over 50, or  you are at high risk for heart disease, you may need your cholesterol levels checked more frequently. Ongoing high lipid and cholesterol levels should be treated with medicines if diet and exercise are not working.  . If you smoke, find out from your health care provider how to quit. If you do not use tobacco, please do not start.  . If you choose to drink alcohol, please do not consume more than 2 drinks per day. One drink is considered to be 12 ounces (355 mL) of beer, 5 ounces (148 mL) of wine, or 1.5 ounces (44 mL) of liquor.  . If you are  74-11 years old, ask your health care provider if you should take aspirin to prevent strokes.  . Use sunscreen. Apply sunscreen liberally and repeatedly throughout the day. You should seek shade when your shadow is shorter than you. Protect yourself by wearing long sleeves, pants, a wide-brimmed hat, and sunglasses year round, whenever you are outdoors.  . Once a month, do a whole body skin exam, using a mirror to look at the skin on your back. Tell your health care provider of new moles, moles that have irregular borders, moles that are larger than a pencil eraser, or moles that have changed in shape or color.

## 2016-09-19 NOTE — Progress Notes (Signed)
Pre visit review using our clinic review tool, if applicable. No additional management support is needed unless otherwise documented below in the visit note. 

## 2016-09-19 NOTE — Telephone Encounter (Signed)
I was unable to print prescription for Ambien. I called [facility] and left a message requesting refill.

## 2016-09-19 NOTE — Progress Notes (Signed)
Subjective:   Todd Howell is a 66 y.o. male who presents for an Initial Medicare Annual Wellness Visit.  Review of Systems  No ROS.  Medicare Wellness Visit. Additional risk factors are reflected in the social history.  Cardiac Risk Factors include: advanced age (>36men, >75 women);dyslipidemia;hypertension;male gender    Objective:    Today's Vitals   09/19/16 1423  BP: 108/68  Pulse: 61  Resp: 16  SpO2: 98%  Weight: 182 lb (82.6 kg)  Height: 5\' 7"  (1.702 m)   Body mass index is 28.51 kg/m.  Current Medications (verified) Outpatient Encounter Prescriptions as of 09/19/2016  Medication Sig  . CRESTOR 20 MG tablet Take 20 mg by mouth at bedtime.   . divalproex (DEPAKOTE ER) 500 MG 24 hr tablet TAKE 3 TABLETS DAILY AT NIGHT  . tadalafil (CIALIS) 20 MG tablet Take 1 tablet (20 mg total) by mouth every other day as needed for erectile dysfunction.  Marland Kitchen telmisartan (MICARDIS) 40 MG tablet Take 1 tablet (40 mg total) by mouth every morning.  . zolpidem (AMBIEN) 5 MG tablet Take 1 tablet (5 mg total) by mouth at bedtime as needed for sleep.  . [DISCONTINUED] zolpidem (AMBIEN) 5 MG tablet Take 1 tablet (5 mg total) by mouth at bedtime as needed for sleep.   No facility-administered encounter medications on file as of 09/19/2016.     Allergies (verified) Patient has no known allergies.   History: Past Medical History:  Diagnosis Date  . Asthma    mainly as a child  . Bladder cancer [facility])    Dr. Jeffie Pollock, [facility]  . High cholesterol   . Hyperlipidemia   . Hypertension    sees  dr.  Terrence Dupont  . Osteoarthritis of right knee 03/03/2012  . Seizures ([place])    Past Surgical History:  Procedure Laterality Date  . ANAL FISSURE REPAIR  09/20/2011   Lat sphincterotomy  . BLADDER SURGERY  02/2005   TURBT   . COLONOSCOPY    . HEMORRHOID SURGERY  09/20/2011   Int hemorrhoidectomy x 2  . ROTATOR CUFF REPAIR  07/2009   right  . TOTAL KNEE ARTHROPLASTY Right 03/03/2012   Dr  Mardelle Matte  . TOTAL KNEE ARTHROPLASTY Right 03/03/2012   Procedure: TOTAL KNEE ARTHROPLASTY;  Surgeon: Johnny Bridge, MD;  Location: [facility];  Service: Orthopedics;  Laterality: Right;   Family History  Problem Relation Age of Onset  . Cancer Mother        colon  . Cancer Father        colon/lung   Social History   Occupational History  . Not on file.   Social History Main Topics  . Smoking status: Former Smoker    Packs/day: 1.00    Years: 30.00    Types: Cigarettes    Quit date: 07/02/1986  . Smokeless tobacco: Never Used  . Alcohol use 1.2 oz/week    1 Glasses of wine, 1 Cans of beer per week     Comment: occasionally  . Drug use: No  . Sexual activity: Not on file   Tobacco Counseling Counseling given: Not Answered   Activities of Daily Living In your present state of health, do you have any difficulty performing the following activities: 09/19/2016  Hearing? N  Vision? N  Difficulty concentrating or making decisions? N  Walking or climbing stairs? N  Dressing or bathing? N  Doing errands, shopping? N  Preparing Food and eating ? N  Using the Toilet? N  In the past six months, have you accidently leaked urine? N  Do you have problems with loss of bowel control? N  Managing your Medications? N  Managing your Finances? N  Housekeeping or managing your Housekeeping? N  Some recent data might be hidden    Immunizations and Health Maintenance Immunization History  Administered Date(s) Administered  . Pneumococcal Conjugate-13 03/14/2016  . Tdap 03/14/2016   Health Maintenance Due  Topic Date Due  . Hepatitis C Screening  1950-12-21    Patient Care Team: Elby Beck, FNP as PCP - General (Nurse Practitioner)  Indicate any recent Medical Services you may have received from other than [facility] providers in the past year (date may be approximate).    Assessment:   This is a routine wellness examination for Todd Howell. Physical assessment deferred to  PCP.   Hearing/Vision screen Hearing Screening Comments: Able to hear conversational tones w/o difficulty. No issues reported.  Vision Screening Comments: 2 years ago. Wears bifocals.   Dietary issues and exercise activities discussed: Current Exercise Habits: Home exercise routine, Type of exercise: strength training/weights;walking, Time (Minutes): 40, Frequency (Times/Week): 2, Weekly Exercise (Minutes/Week): 80, Intensity: Moderate  Goals    . Maintain weight and get physically stronger      Depression Screen PHQ 2/9 Scores 09/19/2016 03/14/2016  PHQ - 2 Score 0 0    Fall Risk Fall Risk  09/19/2016 03/14/2016 04/11/2015  Falls in the past year? No No No    Cognitive Function: Ad8 score reviewed for issues:  Issues making decisions:no  Less interest in hobbies / activities:no  Repeats questions, stories (family complaining):no  Trouble using ordinary gadgets (microwave, computer, phone):no  Forgets the month or year: no  Mismanaging finances: no  Remembering appts:no  Daily problems with thinking and/or memory:no Ad8 score is=0        Screening Tests Health Maintenance  Topic Date Due  . Hepatitis C Screening  Aug 10, 1950  . INFLUENZA VACCINE  01/08/2017 (Originally 08/08/2016)  . PNA vac Low Risk Adult (2 of 2 - PPSV23) 03/14/2017  . COLONOSCOPY  11/07/2025  . TETANUS/TDAP  03/15/2026        Plan:   Follow up with PCP as directed.  Bring a copy of your living will and/or healthcare power of attorney to your next office visit.  I have personally reviewed and noted the following in the patient's chart:   . Medical and social history . Use of alcohol, tobacco or illicit drugs  . Current medications and supplements . Functional ability and status . Nutritional status . Physical activity . Advanced directives . List of other physicians . Vitals . Screenings to include cognitive, depression, and falls . Referrals and appointments  In addition, I have  reviewed and discussed with patient certain preventive protocols, quality metrics, and best practice recommendations. A written personalized care plan for preventive services as well as general preventive health recommendations were provided to patient.     Ree Edman, RN   09/19/2016

## 2016-09-19 NOTE — Progress Notes (Signed)
PCP notes:   Health maintenance: Hepatitis C Screening - will draw with next lab draws  Abnormal screenings: none.   Patient concerns: Pt requests refill on Zolpidem. Ok per Tor Netters, NP.   Nurse concerns: None   Next PCP appt: none scheduled.

## 2016-09-20 NOTE — Progress Notes (Signed)
I have reviewed the patient's Medicare Annual Wellness Visit and agree with the assessment and plan.

## 2016-09-26 NOTE — Telephone Encounter (Signed)
Completed 09/19/16
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
## 2016-10-29 ENCOUNTER — Ambulatory Visit (INDEPENDENT_AMBULATORY_CARE_PROVIDER_SITE_OTHER): Payer: Medicare Other | Admitting: Nurse Practitioner

## 2016-10-29 ENCOUNTER — Encounter: Payer: Self-pay | Admitting: Nurse Practitioner

## 2016-10-29 VITALS — BP 111/76 | HR 60 | Ht 67.0 in | Wt 182.6 lb

## 2016-10-29 DIAGNOSIS — G40309 Generalized idiopathic epilepsy and epileptic syndromes, not intractable, without status epilepticus: Secondary | ICD-10-CM

## 2016-10-29 MED ORDER — DIVALPROEX SODIUM ER 500 MG PO TB24
ORAL_TABLET | ORAL | 3 refills | Status: DC
Start: 2016-10-29 — End: 2017-10-30

## 2016-10-29 NOTE — Progress Notes (Signed)
I reviewed note and agree with plan.   Penni Bombard, MD 43/83/7793, 9:68 PM Certified in Neurology, Neurophysiology and Neuroimaging  [facility] [address] [place], [place] [address] [phone]

## 2016-10-29 NOTE — Patient Instructions (Signed)
Reviewed labs from 03/14/2016 CBC CMP and Depakote level  Continue same dose 1500 mg daily Will refill Call for seizure activity Follow-up yearly and when necessary

## 2016-11-18 NOTE — Progress Notes (Signed)
I reviewed note and agree with plan.   Keyosha Tiedt R. Rhett Najera, MD  Certified in Neurology, Neurophysiology and Neuroimaging  [facility] [address], [address] , [place] [address] [phone]   

## 2017-08-10 ENCOUNTER — Other Ambulatory Visit: Payer: Self-pay | Admitting: Family Medicine

## 2017-08-10 DIAGNOSIS — N529 Male erectile dysfunction, unspecified: Secondary | ICD-10-CM

## 2017-08-11 ENCOUNTER — Other Ambulatory Visit: Payer: Self-pay | Admitting: Family Medicine

## 2017-08-11 DIAGNOSIS — G4700 Insomnia, unspecified: Secondary | ICD-10-CM

## 2017-08-13 NOTE — Telephone Encounter (Signed)
Called and spoke to patient informing him that an appointment would be needed for further refills. Explained to him that its been over a year, he expressed that he will think about it but will probably give [place] a call and establish care.

## 2017-08-19 ENCOUNTER — Encounter: Payer: Medicare Other | Admitting: Family Medicine

## 2017-08-19 ENCOUNTER — Telehealth: Payer: Self-pay | Admitting: Emergency Medicine

## 2017-08-19 ENCOUNTER — Other Ambulatory Visit: Payer: Self-pay | Admitting: Family Medicine

## 2017-08-19 DIAGNOSIS — N529 Male erectile dysfunction, unspecified: Secondary | ICD-10-CM

## 2017-08-19 MED ORDER — TADALAFIL 5 MG PO TABS: 5.0000 mg | ORAL_TABLET | Freq: Every day | ORAL | 2 refills | Status: DC | PRN

## 2017-08-19 NOTE — Telephone Encounter (Signed)
Received PA from [facility] for Tadalafil 20 mg. Sent through covermymeds awaiting decision.

## 2017-08-19 NOTE — Telephone Encounter (Signed)
Diagnoses reviewed. PA submitted.

## 2017-08-19 NOTE — Telephone Encounter (Signed)
PA denied for 20mg . Insurance will cover 5 mg 6 tabs so provider changed the dosage and quantity.

## 2017-08-19 NOTE — Telephone Encounter (Signed)
Received a PA for Tadalafil 20mg . I don't see a diagnosis on his problem list.

## 2017-08-21 NOTE — Telephone Encounter (Signed)
Called and spoke to patient informing him that insurance denied Tadalafil 5 mg as well. Per NP.Carlean Purl patient will need to pay for this medication out of pocket. Understanding verbalized nothing further needed at this time.

## 2017-09-13 ENCOUNTER — Other Ambulatory Visit: Payer: Self-pay | Admitting: Family Medicine

## 2017-09-13 DIAGNOSIS — N529 Male erectile dysfunction, unspecified: Secondary | ICD-10-CM

## 2017-09-18 ENCOUNTER — Other Ambulatory Visit: Payer: Self-pay | Admitting: Family Medicine

## 2017-09-18 DIAGNOSIS — N529 Male erectile dysfunction, unspecified: Secondary | ICD-10-CM

## 2017-10-04 ENCOUNTER — Other Ambulatory Visit: Payer: Self-pay | Admitting: Family Medicine

## 2017-10-04 DIAGNOSIS — N529 Male erectile dysfunction, unspecified: Secondary | ICD-10-CM

## 2017-10-26 ENCOUNTER — Other Ambulatory Visit: Payer: Self-pay | Admitting: Nurse Practitioner

## 2017-10-29 ENCOUNTER — Telehealth: Payer: Self-pay | Admitting: *Deleted

## 2017-10-29 NOTE — Telephone Encounter (Signed)
Patient was scheduled for a f/u appointment tomorrow but is out of town.  He needs a refill on his Depakote. He will be out.  Can he get a refill until he can get in?  Please call.

## 2017-10-30 ENCOUNTER — Ambulatory Visit: Payer: Medicare Other | Admitting: Nurse Practitioner

## 2017-10-30 MED ORDER — DIVALPROEX SODIUM ER 500 MG PO TB24: ORAL_TABLET | ORAL | 0 refills | Status: DC

## 2017-10-30 NOTE — Telephone Encounter (Signed)
Patient has no pending appointment. Is his depakote ok to refill? Please advise.

## 2017-10-30 NOTE — Telephone Encounter (Signed)
Will refill for 1 month until appointment

## 2017-11-27 ENCOUNTER — Other Ambulatory Visit: Payer: Self-pay | Admitting: Emergency Medicine

## 2017-11-27 DIAGNOSIS — N529 Male erectile dysfunction, unspecified: Secondary | ICD-10-CM

## 2017-11-27 MED ORDER — TADALAFIL 5 MG PO TABS: ORAL_TABLET | ORAL | 1 refills | Status: DC

## 2017-11-29 ENCOUNTER — Other Ambulatory Visit: Payer: Self-pay | Admitting: Nurse Practitioner

## 2017-12-02 ENCOUNTER — Telehealth: Payer: Self-pay | Admitting: *Deleted

## 2017-12-02 MED ORDER — DIVALPROEX SODIUM ER 500 MG PO TB24: ORAL_TABLET | ORAL | 0 refills | Status: DC

## 2017-12-02 NOTE — Telephone Encounter (Signed)
Received Depakote ER refill request from [facility], [place] , [place]. Patient did not keep FU in Oct. This RN called patient who stated he is still in [facility], unsure when he'll return. This RN advised him it has been over a year since he was seen. He must have a follow up scheduled, and then the Depakote can be refilled until he is seen.Marland Kitchen He agreed to schedule FU; scheduled with Dr Leta Baptist and advised he arrive 20 minutes early to check in. Depakote refilled x 1 month to [facility], [place], [place]. The patient verbalized understanding, appreciation.

## 2017-12-25 ENCOUNTER — Ambulatory Visit: Payer: Self-pay | Admitting: Diagnostic Neuroimaging

## 2018-01-16 DIAGNOSIS — Z6825 Body mass index (BMI) 25.0-25.9, adult: Secondary | ICD-10-CM | POA: Insufficient documentation

## 2018-02-04 ENCOUNTER — Ambulatory Visit: Payer: Medicare Other | Admitting: Sports Medicine

## 2018-03-13 ENCOUNTER — Telehealth: Payer: Self-pay | Admitting: Family Medicine

## 2018-03-13 NOTE — Telephone Encounter (Signed)
Best number [phone] Pt made cpx appointment 3/30  Needs refill micardis [place] [facility]   [place] He will have this switch to [facility]   Pt has 1 pill left

## 2018-03-14 ENCOUNTER — Other Ambulatory Visit: Payer: Self-pay

## 2018-03-14 MED ORDER — TELMISARTAN 40 MG PO TABS: 40.0000 mg | ORAL_TABLET | Freq: Every morning | ORAL | 0 refills | Status: DC

## 2018-03-14 NOTE — Telephone Encounter (Signed)
Rx has been sent. Called and spoke with patient. Pt advised and voiced understanding.

## 2018-03-14 NOTE — Telephone Encounter (Signed)
Patient called again about his rx.  He's concerned because he took his last pill today and he's in [place]. Please call patient when prescription is sent to [facility] and then he'll transfer the rx to [place].

## 2018-03-15 ENCOUNTER — Other Ambulatory Visit: Payer: Self-pay | Admitting: Family Medicine

## 2018-04-03 ENCOUNTER — Ambulatory Visit: Payer: Medicare Other

## 2018-04-07 ENCOUNTER — Encounter: Payer: Medicare Other | Admitting: Family Medicine

## 2018-04-15 ENCOUNTER — Encounter: Payer: Self-pay | Admitting: Diagnostic Neuroimaging

## 2018-04-15 ENCOUNTER — Telehealth: Payer: Self-pay | Admitting: Diagnostic Neuroimaging
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
## 2018-04-15 NOTE — Telephone Encounter (Addendum)
Spoke to pt and he consented to video visit/smartphone with amy lomax, np for seizures.  He states he has only been taking depakote 500mg  po daily, stretching it out so he takes some daily since he does not have enough.  He has been in [facility] taking care of 2 family members recently. Due to current COVID 19 pandemic, our office is severely reducing in office visits for at least the next 2 weeks, in order to minimize the risk to our patients and healthcare providers.  Pt understands that although there may be some limitations with this type of visit, we will take all precautions to reduce any security or privacy concerns.  Pt understands that this will be treated like an in office visit and we will file with pt's insurance. Pt's email is . Pt understands that the [employer] webex software must be downloaded and operational on the device pt plans to use for the visit. Appt made for 04-16-18 at 0930.  Will be called by check in 30 minutes prior to appt, and then relayed to join meeting 10 minutes prior appt time. Medications, allergies, history medical and surgical, pharmacy updated.

## 2018-04-15 NOTE — Telephone Encounter (Signed)
Pt called in wanting a refill of his divalproex (DEPAKOTE ER) 500 MG 24 hr tablet . I advised patient that he hasnt had a appt and the last appt he promised he canceled and he would need to be seen

## 2018-04-16 ENCOUNTER — Encounter: Payer: Self-pay | Admitting: Family Medicine

## 2018-04-16 ENCOUNTER — Ambulatory Visit (INDEPENDENT_AMBULATORY_CARE_PROVIDER_SITE_OTHER): Payer: Medicare Other | Admitting: Family Medicine

## 2018-04-16 ENCOUNTER — Other Ambulatory Visit: Payer: Self-pay

## 2018-04-16 DIAGNOSIS — G40309 Generalized idiopathic epilepsy and epileptic syndromes, not intractable, without status epilepticus: Secondary | ICD-10-CM | POA: Diagnosis not present

## 2018-04-16 MED ORDER — DIVALPROEX SODIUM ER 500 MG PO TB24: ORAL_TABLET | ORAL | 0 refills | Status: DC

## 2018-04-17 NOTE — Progress Notes (Signed)
I reviewed note and agree with plan.   Penni Bombard, MD 02/14/7822, 23:53 AM Certified in Neurology, Neurophysiology and Neuroimaging  [facility] [address] [place], [place] [address] [phone]

## 2018-04-28 ENCOUNTER — Encounter: Payer: Self-pay | Admitting: Family Medicine

## 2018-04-28 ENCOUNTER — Ambulatory Visit (INDEPENDENT_AMBULATORY_CARE_PROVIDER_SITE_OTHER): Payer: Medicare Other | Admitting: Family Medicine

## 2018-04-28 ENCOUNTER — Other Ambulatory Visit: Payer: Self-pay | Admitting: General Surgery

## 2018-04-28 VITALS — Wt 175.0 lb

## 2018-04-28 DIAGNOSIS — M5030 Other cervical disc degeneration, unspecified cervical region: Secondary | ICD-10-CM | POA: Diagnosis not present

## 2018-04-28 DIAGNOSIS — I10 Essential (primary) hypertension: Secondary | ICD-10-CM | POA: Diagnosis not present

## 2018-04-28 DIAGNOSIS — M542 Cervicalgia: Secondary | ICD-10-CM

## 2018-04-28 DIAGNOSIS — E785 Hyperlipidemia, unspecified: Secondary | ICD-10-CM

## 2018-04-28 DIAGNOSIS — M47819 Spondylosis without myelopathy or radiculopathy, site unspecified: Secondary | ICD-10-CM

## 2018-04-28 DIAGNOSIS — M545 Low back pain, unspecified: Secondary | ICD-10-CM | POA: Insufficient documentation

## 2018-04-28 DIAGNOSIS — G4700 Insomnia, unspecified: Secondary | ICD-10-CM

## 2018-04-28 DIAGNOSIS — M47812 Spondylosis without myelopathy or radiculopathy, cervical region: Secondary | ICD-10-CM

## 2018-04-28 DIAGNOSIS — N529 Male erectile dysfunction, unspecified: Secondary | ICD-10-CM

## 2018-04-28 DIAGNOSIS — M47816 Spondylosis without myelopathy or radiculopathy, lumbar region: Secondary | ICD-10-CM | POA: Insufficient documentation

## 2018-04-28 MED ORDER — ZOLPIDEM TARTRATE 5 MG PO TABS: 5.0000 mg | ORAL_TABLET | Freq: Every evening | ORAL | 0 refills | Status: DC | PRN

## 2018-04-28 MED ORDER — DICLOFENAC SODIUM 50 MG PO TBEC: 50.0000 mg | DELAYED_RELEASE_TABLET | Freq: Two times a day (BID) | ORAL | 1 refills | Status: DC | PRN

## 2018-04-28 MED ORDER — TADALAFIL 5 MG PO TABS: ORAL_TABLET | ORAL | 1 refills | Status: DC

## 2018-04-28 MED ORDER — TRAMADOL HCL 50 MG PO TABS: 50.0000 mg | ORAL_TABLET | Freq: Three times a day (TID) | ORAL | 0 refills | Status: DC | PRN

## 2018-04-28 NOTE — Progress Notes (Signed)
Virtual Visit via Video Note  I connected with Todd Howell on 04/28/18 at 10:30 AM EDT by a video enabled telemedicine application and verified that I am speaking with the correct person using two identifiers. The patient was located in his home and I was in my office.    I discussed the limitations of evaluation and management by telemedicine and the availability of in person appointments. The patient expressed understanding and agreed to proceed.  History of Present Illness: This is a 68 yo male who requests virtual video visit to follow up on several concerns.   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 Seen by ortho and had xrays of neck/back which showed C5-6, C6-7 DDD, multilevel fact arthropathy, L3 decreased vertebral height and scoliosis. It was recommended that he have an MRI which he was unable to get due to scheduling. He was given Medrol dose pack, tramadol and diclofenac. He reports significant improvement with Medrol dose pack and tramadol. He was concerned about potential side effects from diclofenac so he did not take. He has not taken any OTC medication. He tries to workout some at home since gym is closed. Pain is in neck and low back, interferes with sleep, no recent falls, occasional radiation to his buttocks, no numbness/tingling. Feels that his posture has deteriorated.   Seizure disorder- recent virtual visit with neuro, will continue Depakote and follow up in 3 months.   Insomnia- increased with pain, requests refill of ambien to use occasionally.   ED- good relief with prn tadalafil, requests refill.   Past Medical History:  Diagnosis Date  . Asthma    mainly as a child  . Bladder cancer [facility])    Dr. Jeffie Pollock, [facility]  . High cholesterol   . Hyperlipidemia   . Hypertension    sees  dr.  Terrence Dupont  . Osteoarthritis of right knee  03/03/2012  . Seizures ([place])    Past Surgical History:  Procedure Laterality Date  . ANAL FISSURE REPAIR  09/20/2011   Lat sphincterotomy  . BLADDER SURGERY  02/2005   TURBT   . COLONOSCOPY    . HEMORRHOID SURGERY  09/20/2011   Int hemorrhoidectomy x 2  . ROTATOR CUFF REPAIR  07/2009   right  . TOTAL KNEE ARTHROPLASTY Right 03/03/2012   Dr Mardelle Matte  . TOTAL KNEE ARTHROPLASTY Right 03/03/2012   Procedure: TOTAL KNEE ARTHROPLASTY;  Surgeon: Johnny Bridge, MD;  Location: [facility];  Service: Orthopedics;  Laterality: Right;   Family History  Problem Relation Age of Onset  . Cancer Mother        colon  . Cancer Father        colon/lung   Social History   Tobacco Use  . Smoking status: Former Smoker    Packs/day: 1.00    Years: 30.00    Pack years: 30.00    Types: Cigarettes    Last attempt to quit: 07/02/1986    Years since quitting: 31.8  . Smokeless tobacco: Never Used  Substance Use Topics  . Alcohol use: Yes    Alcohol/week: 2.0 standard drinks    Types: 1 Glasses of wine, 1 Cans of beer per week    Comment: occasionally  . Drug use: No      Observations/Objective:  Patient is alert and answers questions appropriately.  Visible skin is unremarkable.  He is normally conversive without shortness of breath.  His mood and affect are appropriate.  Wt Readings from Last 3 Encounters:  04/28/18 175 lb (79.4 kg)  10/29/16 182 lb 9.6 oz (82.8 kg)  09/19/16 182 lb (82.6 kg)     Assessment and Plan: He will have labs drawn and follow-up for complete physical in approximately 3 months.  1. Hyperlipidemia, unspecified hyperlipidemia type - Lipid panel; Future - Comprehensive metabolic panel; Future - CBC with Differential; Future  2. Essential hypertension - Lipid panel; Future - Comprehensive metabolic panel; Future - CBC with Differential; Future  3. DDD (degenerative disc disease), cervical - Work up from [place] orthopedic provider incomplete as patient returned to  [place]. Will refer him to ortho for continued evaluation. Patient in agreement.  - Ambulatory referral to Orthopedic Surgery  4. 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 Spondylosis of lumbar region without myelopathy or radiculopathy - Ambulatory referral to Orthopedic Surgery  6. Spondylosis of cervical region without myelopathy or radiculopathy - Ambulatory referral to Orthopedic Surgery  7. Insomnia, unspecified type - zolpidem (AMBIEN) 5 MG tablet; Take 1 tablet (5 mg total) by mouth at bedtime as needed for sleep. Do not take within 8 hours of tramadol.  Dispense: 30 tablet; Refill: 0  8. Erectile dysfunction, unspecified erectile dysfunction type - tadalafil (CIALIS) 5 MG tablet; TAKE 1 TO 4 TABLETS BY MOUTH EVERY DAY AS NEEDED FOR ERECTILE DYSFUNCTION  Dispense: 32 tablet; Refill: 1  9. Lumbar pain -Discussed interventions for improving pain including trying diclofenac, regular exercise, very sparingly using tramadol - traMADol (ULTRAM) 50 MG tablet; Take 1-2 tablets (50-100 mg total) by mouth every 8 (eight) hours as needed. Do not take within 8 hours of taking Ambien.  Dispense: 30 tablet; Refill: 0 - diclofenac (VOLTAREN) 50 MG EC tablet; Take 1 tablet (50 mg total) by mouth 2 (two) times daily as needed. Take with food.  Dispense: 60 tablet; Refill: 1  10. Neck pain - traMADol (ULTRAM) 50 MG tablet; Take 1-2 tablets (50-100 mg total) by mouth every 8 (eight) hours as needed. Do not take within 8 hours of taking Ambien.  Dispense: 30 tablet; Refill: 0 - diclofenac (VOLTAREN) 50 MG EC tablet; Take 1 tablet (50 mg total) by mouth 2 (two) times daily as needed. Take with food.  Dispense: 60 tablet; Refill: [place], FNP-BC  [facility]  04/28/2018 12:17 PM   Follow Up Instructions: Verbally reviewed instructions with patient as well as sent him a detailed  MyChart message outlining our conversation and recommendations as well as follow-up   I discussed the assessment and treatment plan with the patient. The patient was provided an opportunity to ask questions and all were answered. The patient agreed with the plan and demonstrated an understanding of the instructions.   The patient was advised to call back or seek an in-person evaluation if the symptoms worsen or if the condition fails to improve as anticipated.   Elby Beck, FNP

## 2018-05-12 ENCOUNTER — Other Ambulatory Visit: Payer: Self-pay | Admitting: Family Medicine

## 2018-05-12 DIAGNOSIS — M545 Low back pain, unspecified: Secondary | ICD-10-CM

## 2018-05-12 DIAGNOSIS — M542 Cervicalgia: Secondary | ICD-10-CM

## 2018-05-26 ENCOUNTER — Other Ambulatory Visit: Payer: Self-pay | Admitting: Family Medicine

## 2018-05-26 DIAGNOSIS — N529 Male erectile dysfunction, unspecified: Secondary | ICD-10-CM

## 2018-06-03 ENCOUNTER — Other Ambulatory Visit: Payer: Self-pay | Admitting: Family Medicine

## 2018-06-03 DIAGNOSIS — N529 Male erectile dysfunction, unspecified: Secondary | ICD-10-CM

## 2018-07-08 ENCOUNTER — Other Ambulatory Visit: Payer: Self-pay | Admitting: Physical Medicine and Rehabilitation

## 2018-07-08 DIAGNOSIS — M545 Low back pain, unspecified: Secondary | ICD-10-CM

## 2018-07-16 ENCOUNTER — Other Ambulatory Visit: Payer: Self-pay

## 2018-07-16 ENCOUNTER — Ambulatory Visit: Payer: Medicare Other | Admitting: Family Medicine

## 2018-07-16 ENCOUNTER — Encounter: Payer: Self-pay | Admitting: Family Medicine

## 2018-07-16 VITALS — BP 112/68 | HR 66 | Temp 97.5°F | Ht 67.0 in | Wt 184.8 lb

## 2018-07-16 DIAGNOSIS — G40309 Generalized idiopathic epilepsy and epileptic syndromes, not intractable, without status epilepticus: Secondary | ICD-10-CM

## 2018-07-16 MED ORDER — DIVALPROEX SODIUM ER 500 MG PO TB24: ORAL_TABLET | ORAL | 3 refills | Status: DC
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
## 2018-07-16 NOTE — Patient Instructions (Signed)
Continue Depakote ER 500mg  daily   Follow up in 1 year, sooner if needed    Major Depressive Disorder, Adult Major depressive disorder (MDD) is a mental health condition. MDD often makes you feel sad, hopeless, or helpless. MDD can also cause symptoms in your body. MDD can affect your:  Work.  School.  Relationships.  Other normal activities. MDD can range from mild to very bad. It may occur once (single episode MDD). It can also occur many times (recurrent MDD). The main symptoms of MDD often include:  Feeling sad, depressed, or irritable most of the time.  Loss of interest. MDD symptoms also include:  Sleeping too much or too little.  Eating too much or too little.  A change in your weight.  Feeling tired (fatigue) or having low energy.  Feeling worthless.  Feeling guilty.  Trouble making decisions.  Trouble thinking clearly.  Thoughts of suicide or harming others.  Feeling weak.  Feeling agitated.  Keeping yourself from being around other people (isolation). Follow these instructions at home: Activity  Do these things as told by your doctor: ? Go back to your normal activities. ? Exercise regularly. ? Spend time outdoors. Alcohol  Talk with your doctor about how alcohol can affect your antidepressant medicines.  Do not drink alcohol. Or, limit how much alcohol you drink. ? This means no more than 1 drink a day for nonpregnant women and 2 drinks a day for men. One drink equals one of these:  12 oz of beer.  5 oz of wine.  1 oz of hard liquor. General instructions  Take over-the-counter and prescription medicines only as told by your doctor.  Eat a healthy diet.  Get plenty of sleep.  Find activities that you enjoy. Make time to do them.  Think about joining a support group. Your doctor may be able to suggest a group for you.  Keep all follow-up visits as told by your doctor. This is important. Where to find more information:   [employer] on Mental Illness: ? www.nami.[place]: ? https://carter.com/  National Suicide Prevention Lifeline: ? 562-382-3778. This is free, 24-hour help. Contact a doctor if:  Your symptoms get worse.  You have new symptoms. Get help right away if:  You self-harm.  You see, hear, taste, smell, or feel things that are not present (hallucinate). If you ever feel like you may hurt yourself or others, or have thoughts about taking your own life, get help right away. You can go to your nearest emergency department or call:  Your local emergency services (911 in the [place]  A suicide crisis helpline, such as the National Suicide Prevention Lifeline: ? 346-258-4230. This is open 24 hours a day. This information is not intended to replace advice given to you by your health care provider. Make sure you discuss any questions you have with your health care provider. Document Released: 12/06/2014 Document Revised: 12/07/2016 Document Reviewed: 09/11/2015 Elsevier Patient Education  2020 [place]  After being diagnosed with an anxiety disorder, you may be relieved to know why you have felt or behaved a certain way. It is natural to also feel overwhelmed about the treatment ahead and what it will mean for your life. With care and support, you can manage this condition and recover from it. How to cope with anxiety Dealing with stress Stress is your body's reaction to life changes and events, both good and bad.  Stress can last just a few hours or it can be ongoing. Stress can play a major role in anxiety, so it is important to learn both how to cope with stress and how to think about it differently. Talk with your health care provider or a counselor to learn more about stress reduction. He or she may suggest some stress reduction techniques, such as:  Music therapy. This can include creating or listening to music that  you enjoy and that inspires you.  Mindfulness-based meditation. This involves being aware of your normal breaths, rather than trying to control your breathing. It can be done while sitting or walking.  Centering prayer. This is a kind of meditation that involves focusing on a word, phrase, or sacred image that is meaningful to you and that brings you peace.  Deep breathing. To do this, expand your stomach and inhale slowly through your nose. Hold your breath for 3-5 seconds. Then exhale slowly, allowing your stomach muscles to relax.  Self-talk. This is a skill where you identify thought patterns that lead to anxiety reactions and correct those thoughts.  Muscle relaxation. This involves tensing muscles then relaxing them. Choose a stress reduction technique that fits your lifestyle and personality. Stress reduction techniques take time and practice. Set aside 5-15 minutes a day to do them. Therapists can offer training in these techniques. The training may be covered by some insurance plans. Other things you can do to manage stress include:  Keeping a stress diary. This can help you learn what triggers your stress and ways to control your response.  Thinking about how you respond to certain situations. You may not be able to control everything, but you can control your reaction.  Making time for activities that help you relax, and not feeling guilty about spending your time in this way. Therapy combined with coping and stress-reduction skills provides the best chance for successful treatment. Medicines Medicines can help ease symptoms. Medicines for anxiety include:  Anti-anxiety drugs.  Antidepressants.  Beta-blockers. Medicines may be used as the main treatment for anxiety disorder, along with therapy, or if other treatments are not working. Medicines should be prescribed by a health care provider. Relationships Relationships can play a big part in helping you recover. Try to spend  more time connecting with trusted friends and family members. Consider going to couples counseling, taking family education classes, or going to family therapy. Therapy can help you and others better understand the condition. How to recognize changes in your condition Everyone has a different response to treatment for anxiety. Recovery from anxiety happens when symptoms decrease and stop interfering with your daily activities at home or work. This may mean that you will start to:  Have better concentration and focus.  Sleep better.  Be less irritable.  Have more energy.  Have improved memory. It is important to recognize when your condition is getting worse. Contact your health care provider if your symptoms interfere with home or work and you do not feel like your condition is improving. Where to find help and support: You can get help and support from these sources:  Self-help groups.  Online and [employer].  A trusted spiritual leader.  Couples counseling.  Family education classes.  Family therapy. Follow these instructions at home:  Eat a healthy diet that includes plenty of vegetables, fruits, whole grains, low-fat dairy products, and lean protein. Do not eat a lot of foods that are high in solid fats, added sugars, or salt.  Exercise. Most adults should do the following: ? Exercise for at least 150 minutes each week. The exercise should increase your heart rate and make you sweat (moderate-intensity exercise). ? Strengthening exercises at least twice a week.  Cut down on caffeine, tobacco, alcohol, and other potentially harmful substances.  Get the right amount and quality of sleep. Most adults need 7-9 hours of sleep each night.  Make choices that simplify your life.  Take over-the-counter and prescription medicines only as told by your health care provider.  Avoid caffeine, alcohol, and certain over-the-counter cold medicines. These may make you feel  worse. Ask your pharmacist which medicines to avoid.  Keep all follow-up visits as told by your health care provider. This is important. Questions to ask your health care provider  Would I benefit from therapy?  How often should I follow up with a health care provider?  How long do I need to take medicine?  Are there any long-term side effects of my medicine?  Are there any alternatives to taking medicine? Contact a health care provider if:  You have a hard time staying focused or finishing daily tasks.  You spend many hours a day feeling worried about everyday life.  You become exhausted by worry.  You start to have headaches, feel tense, or have nausea.  You urinate more than normal.  You have diarrhea. Get help right away if:  You have a racing heart and shortness of breath.  You have thoughts of hurting yourself or others. If you ever feel like you may hurt yourself or others, or have thoughts about taking your own life, get help right away. You can go to your nearest emergency department or call:  Your local emergency services (911 in the [place]  A suicide crisis helpline, such as the [place] at 9382983060. This is open 24-hours a day. Summary  Taking steps to deal with stress can help calm you.  Medicines cannot cure anxiety disorders, but they can help ease symptoms.  Family, friends, and partners can play a big part in helping you recover from an anxiety disorder. This information is not intended to replace advice given to you by your health care provider. Make sure you discuss any questions you have with your health care provider. Document Released: 12/20/2015 Document Revised: 12/07/2016 Document Reviewed: 12/20/2015 Elsevier Patient Education  [place].   Seizure, Adult A seizure is a sudden burst of abnormal electrical activity in the brain. Seizures usually last from 30 seconds to 2 minutes. They can cause many  different symptoms. Usually, seizures are not harmful unless they last a long time. What are the causes? Common causes of this condition include:  Fever or infection.  Conditions that affect the brain, such as: ? A brain abnormality that you were born with. ? A brain or head injury. ? Bleeding in the brain. ? A tumor. ? Stroke. ? Brain disorders such as autism or cerebral palsy.  Low blood sugar.  Conditions that are passed from parent to child (are inherited).  Problems with substances, such as: ? Having a reaction to a drug or a medicine. ? Suddenly stopping the use of a substance (withdrawal). In some cases, the cause may not be known. A person who has repeated seizures over time without a clear cause has a condition called epilepsy. What increases the risk? You are more likely to get this condition if you have:  A family history of epilepsy.  Had a seizure in  the past.  A brain disorder.  A history of head injury, lack of oxygen at birth, or strokes. What are the signs or symptoms? There are many types of seizures. The symptoms vary depending on the type of seizure you have. Examples of symptoms during a seizure include:  Shaking (convulsions).  Stiffness in the body.  Passing out (losing consciousness).  Head nodding.  Staring.  Not responding to sound or touch.  Loss of bladder control and bowel control. Some people have symptoms right before and right after a seizure happens. Symptoms before a seizure may include:  Fear.  Worry (anxiety).  Feeling like you may vomit (nauseous).  Feeling like the room is spinning (vertigo).  Feeling like you saw or heard something before (dj vu).  Odd tastes or smells.  Changes in how you see. You may see flashing lights or spots. Symptoms after a seizure happens can include:  Confusion.  Sleepiness.  Headache.  Weakness on one side of the body. How is this treated? Most seizures will stop on their  own in under 5 minutes. In these cases, no treatment is needed. Seizures that last longer than 5 minutes will usually need treatment. Treatment can include:  Medicines given through an IV tube.  Avoiding things that are known to cause your seizures. These can include medicines that you take for another condition.  Medicines to treat epilepsy.  Surgery to stop the seizures. This may be needed if medicines do not help. Follow these instructions at home: Medicines  Take over-the-counter and prescription medicines only as told by your doctor.  Do not eat or drink anything that may keep your medicine from working, such as alcohol. Activity  Do not do any activities that would be dangerous if you had another seizure, like driving or swimming. Wait until your doctor says it is safe for you to do them.  If you live in the [place]., ask your local DMV (department of motor vehicles) when you can drive.  Get plenty of rest. Teaching others Teach friends and family what to do when you have a seizure. They should:  Lay you on the ground.  Protect your head and body.  Loosen any tight clothing around your neck.  Turn you on your side.  Not hold you down.  Not put anything into your mouth.  Know whether or not you need emergency care.  Stay with you until you are better.  General instructions  Contact your doctor each time you have a seizure.  Avoid anything that gives you seizures.  Keep a seizure diary. Write down: ? What you think caused each seizure. ? What you remember about each seizure.  Keep all follow-up visits as told by your doctor. This is important. Contact a doctor if:  You have another seizure.  You have seizures more often.  There is any change in what happens during your seizures.  You keep having seizures with treatment.  You have symptoms of being sick or having an infection. Get help right away if:  You have a seizure that: ? Lasts longer than 5  minutes. ? Is different than seizures you had before. ? Makes it harder to breathe. ? Happens after you hurt your head.  You have any of these symptoms after a seizure: ? Not being able to speak. ? Not being able to use a part of your body. ? Confusion. ? A bad headache.  You have two or more seizures in a row.  You do  not wake up right after a seizure.  You get hurt during a seizure. These symptoms may be an emergency. Do not wait to see if the symptoms will go away. Get medical help right away. Call your local emergency services (911 in the [place] Do not drive yourself to the hospital. Summary  Seizures usually last from 30 seconds to 2 minutes. Usually, they are not harmful unless they last a long time.  Do not eat or drink anything that may keep your medicine from working, such as alcohol.  Teach friends and family what to do when you have a seizure.  Contact your doctor each time you have a seizure. This information is not intended to replace advice given to you by your health care provider. Make sure you discuss any questions you have with your health care provider. Document Released: 06/13/2007 Document Revised: 03/14/2018 Document Reviewed: 03/14/2018 Elsevier Patient Education  [place].

## 2018-07-17 ENCOUNTER — Telehealth: Payer: Self-pay | Admitting: *Deleted

## 2018-07-17 LAB — COMPREHENSIVE METABOLIC PANEL
ALT: 15 IU/L (ref 0–44)
AST: 22 IU/L (ref 0–40)
Albumin/Globulin Ratio: 1.6 (ref 1.2–2.2)
Albumin: 4.3 g/dL (ref 3.8–4.8)
Alkaline Phosphatase: 45 IU/L (ref 39–117)
BUN/Creatinine Ratio: 26 — ABNORMAL HIGH (ref 10–24)
BUN: 19 mg/dL (ref 8–27)
Bilirubin Total: 0.4 mg/dL (ref 0.0–1.2)
CO2: 23 mmol/L (ref 20–29)
Calcium: 9.5 mg/dL (ref 8.6–10.2)
Chloride: 106 mmol/L (ref 96–106)
Creatinine, Ser: 0.74 mg/dL — ABNORMAL LOW (ref 0.76–1.27)
GFR calc Af Amer: 110 mL/min/{1.73_m2} (ref >59)
GFR calc non Af Amer: 95 mL/min/{1.73_m2} (ref >59)
Globulin, Total: 2.7 g/dL (ref 1.5–4.5)
Glucose: 89 mg/dL (ref 65–99)
Potassium: 4.6 mmol/L (ref 3.5–5.2)
Sodium: 142 mmol/L (ref 134–144)
Total Protein: 7 g/dL (ref 6.0–8.5)

## 2018-07-17 LAB — VALPROIC ACID LEVEL: Valproic Acid Lvl: 32 ug/mL — ABNORMAL LOW (ref 50–100)

## 2018-07-17 LAB — CBC WITH DIFFERENTIAL/PLATELET
Basophils Absolute: 0.1 10*3/uL (ref 0.0–0.2)
Basos: 1 %
EOS (ABSOLUTE): 0.2 10*3/uL (ref 0.0–0.4)
Eos: 3 %
Hematocrit: 40.4 % (ref 37.5–51.0)
Hemoglobin: 14.2 g/dL (ref 13.0–17.7)
Immature Grans (Abs): 0 10*3/uL (ref 0.0–0.1)
Immature Granulocytes: 0 %
Lymphocytes Absolute: 2.4 10*3/uL (ref 0.7–3.1)
Lymphs: 35 %
MCH: 32.3 pg (ref 26.6–33.0)
MCHC: 35.1 g/dL (ref 31.5–35.7)
MCV: 92 fL (ref 79–97)
Monocytes Absolute: 1.2 10*3/uL — ABNORMAL HIGH (ref 0.1–0.9)
Monocytes: 17 %
Neutrophils Absolute: 3 10*3/uL (ref 1.4–7.0)
Neutrophils: 44 %
Platelets: 187 10*3/uL (ref 150–450)
RBC: 4.39 x10E6/uL (ref 4.14–5.80)
RDW: 12.7 % (ref 11.6–15.4)
WBC: 6.9 10*3/uL (ref 3.4–10.8)

## 2018-07-17 NOTE — Telephone Encounter (Signed)
Relayed to pt in mychart.

## 2018-07-17 NOTE — Progress Notes (Signed)
I reviewed note and agree with plan.   Penni Bombard, MD 04/14/6544, 5:03 PM Certified in Neurology, Neurophysiology and Neuroimaging  [facility] [address], [place], [place] [address] [phone]

## 2018-07-17 NOTE — Telephone Encounter (Signed)
I do not want to change regimen. Please make sure that he is aware that missed doses can cause low readings. Remind him that it is important that he take this daily without missed doses.

## 2018-07-17 NOTE — Telephone Encounter (Signed)
-----   Message from Debbora Presto, NP sent at 07/17/2018 11:34 AM EDT ----- Please let him know that his Depakote levels are low. This can happen with missed doses. Please verify medication regimen and advise regular dosing.

## 2018-07-25 ENCOUNTER — Ambulatory Visit (INDEPENDENT_AMBULATORY_CARE_PROVIDER_SITE_OTHER): Payer: Medicare Other | Admitting: Family Medicine

## 2018-07-25 VITALS — Wt 184.0 lb

## 2018-07-25 DIAGNOSIS — F329 Major depressive disorder, single episode, unspecified: Secondary | ICD-10-CM | POA: Diagnosis not present

## 2018-07-25 DIAGNOSIS — F419 Anxiety disorder, unspecified: Secondary | ICD-10-CM

## 2018-07-25 MED ORDER — HYDROXYZINE HCL 50 MG PO TABS: 25.0000 mg | ORAL_TABLET | Freq: Three times a day (TID) | ORAL | 0 refills | Status: DC | PRN

## 2018-07-25 MED ORDER — ESCITALOPRAM OXALATE 10 MG PO TABS: 10.0000 mg | ORAL_TABLET | Freq: Every day | ORAL | 1 refills | Status: DC

## 2018-07-25 NOTE — Progress Notes (Signed)
Virtual Visit via Video Note  I connected with Todd Howell on 07/25/18 at 11:40 AM EDT by a video enabled telemedicine application and verified that I am speaking with the correct person using two identifiers.  Location: Patient: In his home Provider: [place]   I discussed the limitations of evaluation and management by telemedicine and the availability of in person appointments. The patient expressed understanding and agreed to proceed.  History of Present Illness: This is a 68 yo male who requests virtual visit today to discuss increased depression and anxiety. He is very concerned about current events and the way things are going in the world. He is concerned for the health and safety of his sons who live in [place]. His girlfriend has moved in with him which has been helpful but she has some health issues and he is in a caretaker role.  He has never had these issues before. Has never been on medication for mood. He feels fatigued during the day and unmotivated to do chores and projects. He feels wound up sometimes and can't relax. Prior to pandemic, he exercised at the gym most days and it has been difficult for him to not have this as an outlet.  He denies SI.   Past Medical History:  Diagnosis Date  . Asthma    mainly as a child  . Bladder cancer [facility])    Dr. Jeffie Pollock, [facility]  . High cholesterol   . Hyperlipidemia   . Hypertension    sees  dr.  Terrence Dupont  . Osteoarthritis of right knee 03/03/2012  . Seizures ([place])    Past Surgical History:  Procedure Laterality Date  . ANAL FISSURE REPAIR  09/20/2011   Lat sphincterotomy  . BLADDER SURGERY  02/2005   TURBT   . COLONOSCOPY    . HEMORRHOID SURGERY  09/20/2011   Int hemorrhoidectomy x 2  . ROTATOR CUFF REPAIR  07/2009   right  . TOTAL KNEE ARTHROPLASTY Right 03/03/2012   Dr Mardelle Matte  . TOTAL KNEE ARTHROPLASTY Right 03/03/2012   Procedure: TOTAL KNEE ARTHROPLASTY;  Surgeon: Johnny Bridge, MD;  Location: [place];   Service: Orthopedics;  Laterality: Right;   Family History  Problem Relation Age of Onset  . Cancer Mother        colon  . Cancer Father        colon/lung   Social History   Tobacco Use  . Smoking status: Former Smoker    Packs/day: 1.00    Years: 30.00    Pack years: 30.00    Types: Cigarettes    Quit date: 07/02/1986    Years since quitting: 32.0  . Smokeless tobacco: Never Used  Substance Use Topics  . Alcohol use: Yes    Alcohol/week: 2.0 standard drinks    Types: 1 Glasses of wine, 1 Cans of beer per week    Comment: occasionally  . Drug use: No      Observations/Objective: He is alert and answers questions appropriately. He speaks in complete sentences without shortness of breath, audible wheeze or witnessed cough. His mood and affect are appropriate.   Wt 184 lb (83.5 kg) Comment: per patient  BMI 28.82 kg/m  Wt Readings from Last 3 Encounters:  07/25/18 184 lb (83.5 kg)  07/16/18 184 lb 12.8 oz (83.8 kg)  04/28/18 175 lb (79.4 kg)   Depression screen [facility] 2/9 07/25/2018 09/19/2016 03/14/2016  Decreased Interest 3 0 0  Down, Depressed, Hopeless 3 0 0  PHQ - 2 Score 6 0 0  Altered sleeping 1 - -  Tired, decreased energy 3 - -  Change in appetite 3 - -  Feeling bad or failure about yourself  1 - -  Trouble concentrating 3 - -  Moving slowly or fidgety/restless 1 - -  Suicidal thoughts 0 - -  PHQ-9 Score 18 - -  Difficult doing work/chores Very difficult - -   GAD 7 : Generalized Anxiety Score 07/25/2018  Nervous, Anxious, on Edge 3  Control/stop worrying 3  Worry too much - different things 3  Trouble relaxing 3  Restless 3  Easily annoyed or irritable 3  Afraid - awful might happen 1  Total GAD 7 Score 19     Assessment and Plan: 1. Anxiety and depression - encouraged him to consider therapy as adjunct to medication and he is not interested at this time - reviewed medication, potential side effects and expectations of therapy - follow up in 6-8  weeks, sooner if worsening symptoms, problems with medication - escitalopram (LEXAPRO) 10 MG tablet; Take 1 tablet (10 mg total) by mouth daily.  Dispense: 90 tablet; Refill: 1 - hydrOXYzine (ATARAX/VISTARIL) 50 MG tablet; Take 0.5-1 tablets (25-50 mg total) by mouth 3 (three) times daily as needed for anxiety. May cause drowsiness.  Dispense: 30 tablet; Refill: 0   Todd Reamer, FNP-BC  [facility]  07/26/2018 8:21 AM   Follow Up Instructions:    I discussed the assessment and treatment plan with the patient. The patient was provided an opportunity to ask questions and all were answered. The patient agreed with the plan and demonstrated an understanding of the instructions.   The patient was advised to call back or seek an in-person evaluation if the symptoms worsen or if the condition fails to improve as anticipated.    Elby Beck, FNP

## 2018-07-26 ENCOUNTER — Encounter: Payer: Self-pay | Admitting: Family Medicine

## 2018-07-26 ENCOUNTER — Other Ambulatory Visit: Payer: Self-pay

## 2018-07-26 ENCOUNTER — Ambulatory Visit
Admission: RE | Admit: 2018-07-26 | Discharge: 2018-07-26 | Disposition: A | Discharge status: Home / Self Care | Payer: Medicare Other | Source: Ambulatory Visit | Attending: Physical Medicine and Rehabilitation | Admitting: Physical Medicine and Rehabilitation

## 2018-07-26 DIAGNOSIS — M545 Low back pain, unspecified: Secondary | ICD-10-CM

## 2018-07-31 ENCOUNTER — Ambulatory Visit: Payer: Medicare Other

## 2018-08-01 ENCOUNTER — Encounter: Payer: Medicare Other | Admitting: Family Medicine

## 2018-08-04 ENCOUNTER — Other Ambulatory Visit: Payer: Self-pay | Admitting: Family Medicine

## 2018-08-04 DIAGNOSIS — N529 Male erectile dysfunction, unspecified: Secondary | ICD-10-CM

## 2018-08-04 NOTE — Telephone Encounter (Signed)
Was there a reason that this prescription refill request was denied? Can you tell who denied it?

## 2018-08-05 ENCOUNTER — Other Ambulatory Visit: Payer: Self-pay | Admitting: Family Medicine

## 2018-08-05 DIAGNOSIS — N529 Male erectile dysfunction, unspecified: Secondary | ICD-10-CM

## 2018-08-05 MED ORDER — TADALAFIL 5 MG PO TABS: ORAL_TABLET | ORAL | 1 refills | Status: DC

## 2018-08-05 NOTE — Telephone Encounter (Signed)
Prescription refilled.

## 2018-08-07 ENCOUNTER — Telehealth: Payer: Self-pay | Admitting: *Deleted

## 2018-08-07 NOTE — Telephone Encounter (Signed)
Received fax from [facility] requesting PA for Tadalafil 5 mg.  PA completed on CoverMyMeds.  Sent for review.  Can take up to 72 hours for a decision.

## 2018-08-20 ENCOUNTER — Ambulatory Visit: Payer: Medicare Other | Admitting: Psychology

## 2018-10-05 ENCOUNTER — Other Ambulatory Visit: Payer: Self-pay | Admitting: Family Medicine

## 2018-10-05 DIAGNOSIS — N529 Male erectile dysfunction, unspecified: Secondary | ICD-10-CM

## 2018-10-06 ENCOUNTER — Other Ambulatory Visit: Payer: Self-pay | Admitting: Family Medicine

## 2018-10-06 DIAGNOSIS — N529 Male erectile dysfunction, unspecified: Secondary | ICD-10-CM

## 2018-10-07 NOTE — Telephone Encounter (Signed)
Refill request for  Medication: Tadalafil 5mg  Last Filled: 08/05/2018, #32 x 1 rf Previous / Upcoming Appt: 07/25/2018  Please advise Todd Howell, thanks.

## 2018-10-07 NOTE — Telephone Encounter (Signed)
Duplicate request.  Send to Jackelyn Poling already for approval.  Nothing further needed.

## 2019-02-17 ENCOUNTER — Telehealth: Payer: Self-pay | Admitting: Family Medicine

## 2019-02-17 MED ORDER — DIVALPROEX SODIUM ER 500 MG PO TB24: 500.0000 mg | ORAL_TABLET | Freq: Every day | ORAL | 0 refills | Status: DC

## 2019-02-17 NOTE — Telephone Encounter (Signed)
I filled emergency fill #12 tabs to [facility] in [place].

## 2019-02-17 NOTE — Telephone Encounter (Signed)
Patient called in and stated he had to go to [facility] and he is realizing he forgot his Depakote at home and wants to know if he can receive a 10-12 day supply sent to the pharmacy in the area , he states he will pay the amount owed.   [place] [id number] - [place], [place]

## 2019-03-23 ENCOUNTER — Other Ambulatory Visit: Payer: Self-pay

## 2019-03-23 ENCOUNTER — Encounter: Payer: Self-pay | Admitting: Family Medicine

## 2019-03-23 ENCOUNTER — Ambulatory Visit (INDEPENDENT_AMBULATORY_CARE_PROVIDER_SITE_OTHER): Payer: Medicare Other | Admitting: Family Medicine

## 2019-03-23 DIAGNOSIS — F419 Anxiety disorder, unspecified: Secondary | ICD-10-CM

## 2019-03-23 DIAGNOSIS — F329 Major depressive disorder, single episode, unspecified: Secondary | ICD-10-CM

## 2019-03-23 DIAGNOSIS — N529 Male erectile dysfunction, unspecified: Secondary | ICD-10-CM | POA: Diagnosis not present

## 2019-03-23 MED ORDER — ESCITALOPRAM OXALATE 20 MG PO TABS: 20.0000 mg | ORAL_TABLET | Freq: Every day | ORAL | 1 refills | Status: DC

## 2019-03-23 MED ORDER — TADALAFIL 5 MG PO TABS: ORAL_TABLET | ORAL | 1 refills | Status: DC

## 2019-03-23 NOTE — Progress Notes (Signed)
Virtual Visit via Video Note  I connected with Todd Howell on 03/23/19 at 11:30 AM EDT by a video enabled telemedicine application and verified that I am speaking with the correct person using two identifiers.  Location: Patient: In his home in [place] Provider: Reynolds Persons participating in virtual visit:   I discussed the limitations of evaluation and management by telemedicine and the availability of in person appointments. The patient expressed understanding and agreed to proceed.  History of Present Illness: Chief Complaint  Patient presents with  . Medication Management    Refill Lexapro and Cialas - Pt is in [place], part time move.   This is a 69 yo male who presents today for virtual visit for follow up of depression/ anxiety and ED.   Depression/Anxiety-he has been on escitalopram 10 mg since last July.  Not sure how much it is helping.  He is currently down in [place] and living with his significant other.  Although his relationship with her is going well, he is still dealing with his mother's death.  Has not been doing his typical self-care measures.  He was an avid exerciser and has not been doing this recently.  He has not been leaving the house much.  His sleep is disrupted.  He is drinking a lot of coffee all throughout the day and evening.  He is napping during the day.  He is frustrated with his weight gain.  He feels like his mood is okay but he has been increasingly frustrated and lacking in patients recently.  Erectile dysfunction-Cialis is working well for him.   He is considering moving to [place].  He and his significant other are looking for a home.  He will be back in [place] in 1 to 2 months.  Observations/Objective: Patient is alert and answers questions appropriately.  Visible skin is unremarkable.  He is normally conversive without increased work of breathing, audible wheeze or witnessed cough.  His mood and affect are appropriate.  BP  123/79   Pulse 60   Wt 191 lb (86.6 kg)   BMI 29.91 kg/m  Wt Readings from Last 3 Encounters:  03/23/19 191 lb (86.6 kg)  07/25/18 184 lb (83.5 kg)  07/16/18 184 lb 12.8 oz (83.8 kg)    Assessment and Plan: 1. Anxiety and depression -We will increase escitalopram from 10 to 20 mg -Discussed importance of good sleep hygiene, decreasing caffeine intake after lunch, regular exercise, consuming primarily [employer] and considering counseling. - escitalopram (LEXAPRO) 20 MG tablet; Take 1 tablet (20 mg total) by mouth daily.  Dispense: 90 tablet; Refill: 1 -I have asked him to follow-up with me in 12 weeks, sooner if worsening symptoms or no improvement with increased dose  2. Erectile dysfunction, unspecified erectile dysfunction type - tadalafil (CIALIS) 5 MG tablet; TAKE 1 TO 4 TABLETS BY MOUTH EVERY DAY AS NEEDED FOR ERECTILE DYSFUNCTION.  Dispense: 64 tablet; Refill: [place], FNP-BC  [facility]  03/26/2019 9:50 AM   Follow Up Instructions:    I discussed the assessment and treatment plan with the patient. The patient was provided an opportunity to ask questions and all were answered. The patient agreed with the plan and demonstrated an understanding of the instructions.   The patient was advised to call back or seek an in-person evaluation if the symptoms worsen or if the condition fails to improve as anticipated.  Elby Beck, FNP

## 2019-03-26 ENCOUNTER — Encounter: Payer: Self-pay | Admitting: Family Medicine

## 2019-04-15 ENCOUNTER — Telehealth: Payer: Self-pay

## 2019-04-15 NOTE — Telephone Encounter (Signed)
Please schedule patient for 3 months follow up for June 15th or after when possible. Thank you

## 2019-04-16 NOTE — Telephone Encounter (Signed)
Pt wanted to scheduled on 6/11.

## 2019-04-16 NOTE — Telephone Encounter (Signed)
With Tor Netters

## 2019-05-04 ENCOUNTER — Telehealth: Payer: Self-pay | Admitting: Family Medicine

## 2019-05-04 NOTE — Telephone Encounter (Signed)
Please call patient and see if he is currently taking this medication? It was last filled by me over a year ago with only a 90 day supply. If he is still taking daily, please ask him who is prescribing for him?

## 2019-05-04 NOTE — Telephone Encounter (Signed)
Patient stated he is out of [place] and has ran out of his medication  Patient would like his prescription sent to a pharmacy in [place]   telmisartan (MICARDIS) 40 MG tablet    [place], [place]

## 2019-05-04 NOTE — Telephone Encounter (Signed)
This rx was last refilled on 03/17/2018 for #90 with 0 refills. Patient's last appt was a virtual visit on 03/23/19 to discuss anxiety and depression & ED. Patient does have a f/u scheduled for 06/19/19. Jackelyn Poling, is this ok to refill?

## 2019-05-04 NOTE — Telephone Encounter (Signed)
Noted. OK to refill, I just wanted to make sure he was taking daily.

## 2019-05-04 NOTE — Telephone Encounter (Signed)
I contacted patient. He states he had a previous year supply from Dr. Charolette Forward - his cardiologist. He states that there office is requiring an office visit before they refill this but he states he is down in [place] and he is unsure when he will be able to get back and make an appt. He states Debbie refilled this medication once in for a temporary supply - but he states he is going to call back to Dr. Zenia Resides office in the meantime to see if they will send him in some of this medication until he can get back home. He states he will let us know if he needs anything further. Will route to [place] as FYI.

## 2019-06-19 ENCOUNTER — Ambulatory Visit: Payer: Medicare Other | Admitting: Family Medicine

## 2019-06-24 ENCOUNTER — Ambulatory Visit: Payer: Medicare Other

## 2019-06-26 ENCOUNTER — Encounter: Payer: Self-pay | Admitting: Family Medicine

## 2019-06-26 ENCOUNTER — Ambulatory Visit: Payer: Medicare Other | Admitting: Family Medicine

## 2019-06-26 ENCOUNTER — Other Ambulatory Visit: Payer: Self-pay

## 2019-06-26 VITALS — BP 110/70 | HR 60 | Temp 97.6°F | Ht 67.0 in | Wt 191.0 lb

## 2019-06-26 DIAGNOSIS — F419 Anxiety disorder, unspecified: Secondary | ICD-10-CM

## 2019-06-26 DIAGNOSIS — Z79899 Other long term (current) drug therapy: Secondary | ICD-10-CM | POA: Diagnosis not present

## 2019-06-26 DIAGNOSIS — F329 Major depressive disorder, single episode, unspecified: Secondary | ICD-10-CM

## 2019-06-26 DIAGNOSIS — E785 Hyperlipidemia, unspecified: Secondary | ICD-10-CM

## 2019-06-26 DIAGNOSIS — I10 Essential (primary) hypertension: Secondary | ICD-10-CM | POA: Diagnosis not present

## 2019-06-26 DIAGNOSIS — G40309 Generalized idiopathic epilepsy and epileptic syndromes, not intractable, without status epilepticus: Secondary | ICD-10-CM

## 2019-06-26 NOTE — Progress Notes (Signed)
Subjective:    Patient ID: Todd Howell, male    DOB: [birth year], 69 y.o.   MRN: [id number]  HPI Chief Complaint  Patient presents with  . Follow-up   This is a 69 yo male who presents today for follow up of anxiety/ depression. Accompanied by his girlfriend.   Has been feeling "fat," stopped going to the gym. Has been eating poorly, more sugars and junk food.   Had acute episode of SOB while in [place]. Negative COVID, negative EKG.   History of generalized tonic clonic epileptic seizures. Sees neurology annually. Is maintained on depakote. No recent seizure activity in many years.    Review of Systems Denies headaches, visual changes, chest pain, SOB, abdominal pain, nausea/ vomiting, diarrhea, constipation, dysuria, hematuria, urinary frequency, muscle/ joint pain    Objective:   Physical Exam Physical Exam  Constitutional: Oriented to person, place, and time. He appears well-developed and well-nourished.  HENT:  Head: Normocephalic and atraumatic.  Eyes: Conjunctivae are normal.  Neck: Normal range of motion. Neck supple.  Cardiovascular: Normal rate, regular rhythm and normal heart sounds.   Pulmonary/Chest: Effort normal and breath sounds normal.  Musculoskeletal: Normal range of motion. No LE edema.  Neurological: Alert and oriented to person, place, and time.  Skin: Skin is warm and dry.  Psychiatric: Normal mood and affect. Behavior is normal. Judgment and thought content normal.  Vitals reviewed.     BP 110/70 (BP Location: Left Arm, Patient Position: Sitting, Cuff Size: Normal)   Pulse 60   Temp 97.6 F (36.4 C) (Temporal)   Ht 5\' 7"  (1.702 m)   Wt 191 lb (86.6 kg)   SpO2 97%   BMI 29.91 kg/m  Wt Readings from Last 3 Encounters:  06/26/19 191 lb (86.6 kg)  03/23/19 191 lb (86.6 kg)  07/25/18 184 lb (83.5 kg)   Depression screen [facility] 2/9 06/26/2019 07/25/2018 09/19/2016 03/14/2016  Decreased Interest 1 3 0 0  Down, Depressed, Hopeless 2 3 0 0  PHQ - 2  Score 3 6 0 0  Altered sleeping 2 1 - -  Tired, decreased energy 2 3 - -  Change in appetite 0 3 - -  Feeling bad or failure about yourself  0 1 - -  Trouble concentrating 0 3 - -  Moving slowly or fidgety/restless 0 1 - -  Suicidal thoughts 0 0 - -  PHQ-9 Score 7 18 - -  Difficult doing work/chores Not difficult at all Very difficult - -   GAD 7 : Generalized Anxiety Score 06/26/2019 07/25/2018  Nervous, Anxious, on Edge 1 3  Control/stop worrying 1 3  Worry too much - different things 1 3  Trouble relaxing 1 3  Restless 1 3  Easily annoyed or irritable 1 3  Afraid - awful might happen 0 1  Total GAD 7 Score 6 19  Anxiety Difficulty Not difficult at all -         Assessment & Plan:  1. Hyperlipidemia, unspecified hyperlipidemia type - has gained weight, encouraged improved eating habits, exercise - CBC with Differential/Platelet - Lipid panel - VITAMIN D 25 Hydroxy (Vit-D Deficiency, Fractures) - Comprehensive metabolic panel - TSH  2. Anxiety and depression - improved with escitalopram, discussed non pharmacologic interventions, especially eating whole foods and regular exercise - CBC with Differential/Platelet - Lipid panel - VITAMIN D 25 Hydroxy (Vit-D Deficiency, Fractures) - Comprehensive metabolic panel - TSH - follow up in 6 months  3. Essential hypertension - well  controlled - CBC with Differential/Platelet - Lipid panel - VITAMIN D 25 Hydroxy (Vit-D Deficiency, Fractures) - Comprehensive metabolic panel - TSH  4. 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  Safety  protocols were in place, including screening questions prior to the visit, additional usage of staff PPE, and extensive cleaning of exam room while observing appropriate contact time as indicated for disinfecting solutions.      Clarene Reamer, FNP-BC  [facility] at [facility]  06/26/2019 5:53 PM

## 2019-06-26 NOTE — Patient Instructions (Signed)
Good to see you today  Increase your activity slowly  Work on making healthy food choices

## 2019-06-27 LAB — LIPID PANEL
Cholesterol: 117 mg/dL (ref <200)
HDL: 48 mg/dL (ref >=40)
LDL Cholesterol (Calc): 55 mg/dL (calc)
Non-HDL Cholesterol (Calc): 69 mg/dL (calc) (ref <130)
Total CHOL/HDL Ratio: 2.4 (calc) (ref <5.0)
Triglycerides: 61 mg/dL (ref <150)

## 2019-06-27 LAB — VITAMIN D 25 HYDROXY (VIT D DEFICIENCY, FRACTURES): Vit D, 25-Hydroxy: 32 ng/mL (ref 30–100)

## 2019-06-27 LAB — CBC WITH DIFFERENTIAL/PLATELET
Absolute Monocytes: 718 cells/uL (ref 200–950)
Basophils Absolute: 69 cells/uL (ref 0–200)
Basophils Relative: 1.1 %
Eosinophils Absolute: 189 cells/uL (ref 15–500)
Eosinophils Relative: 3 %
HCT: 42.6 % (ref 38.5–50.0)
Hemoglobin: 14.6 g/dL (ref 13.2–17.1)
Lymphs Abs: 1865 cells/uL (ref 850–3900)
MCH: 31.6 pg (ref 27.0–33.0)
MCHC: 34.3 g/dL (ref 32.0–36.0)
MCV: 92.2 fL (ref 80.0–100.0)
MPV: 10.9 fL (ref 7.5–12.5)
Monocytes Relative: 11.4 %
Neutro Abs: 3459 cells/uL (ref 1500–7800)
Neutrophils Relative %: 54.9 %
Platelets: 200 10*3/uL (ref 140–400)
RBC: 4.62 10*6/uL (ref 4.20–5.80)
RDW: 12.8 % (ref 11.0–15.0)
Total Lymphocyte: 29.6 %
WBC: 6.3 10*3/uL (ref 3.8–10.8)

## 2019-06-27 LAB — COMPREHENSIVE METABOLIC PANEL
AG Ratio: 1.5 (calc) (ref 1.0–2.5)
ALT: 17 U/L (ref 9–46)
AST: 19 U/L (ref 10–35)
Albumin: 4.3 g/dL (ref 3.6–5.1)
Alkaline phosphatase (APISO): 42 U/L (ref 35–144)
BUN: 18 mg/dL (ref 7–25)
CO2: 28 mmol/L (ref 20–32)
Calcium: 10 mg/dL (ref 8.6–10.3)
Chloride: 104 mmol/L (ref 98–110)
Creat: 0.78 mg/dL (ref 0.70–1.25)
Globulin: 2.8 g/dL (calc) (ref 1.9–3.7)
Glucose, Bld: 89 mg/dL (ref 65–99)
Potassium: 4.7 mmol/L (ref 3.5–5.3)
Sodium: 139 mmol/L (ref 135–146)
Total Bilirubin: 0.6 mg/dL (ref 0.2–1.2)
Total Protein: 7.1 g/dL (ref 6.1–8.1)

## 2019-06-27 LAB — VALPROIC ACID LEVEL: Valproic Acid Lvl: 39.1 mg/L — ABNORMAL LOW (ref 50.0–100.0)

## 2019-06-27 LAB — TSH: TSH: 2.23 mIU/L (ref 0.40–4.50)

## 2019-07-07 ENCOUNTER — Ambulatory Visit: Payer: Medicare Other

## 2019-07-16 ENCOUNTER — Other Ambulatory Visit: Payer: Self-pay | Admitting: Family Medicine

## 2019-07-16 DIAGNOSIS — N529 Male erectile dysfunction, unspecified: Secondary | ICD-10-CM

## 2019-07-17 NOTE — Telephone Encounter (Signed)
Last refilled 03/23/19 #64 x 1 refill Last OV 03/23/19  Please advise, thanks.

## 2019-07-20 ENCOUNTER — Ambulatory Visit: Payer: Medicare Other | Admitting: Family Medicine

## 2019-07-27 ENCOUNTER — Ambulatory Visit: Payer: Medicare Other

## 2019-07-27 ENCOUNTER — Telehealth: Payer: Self-pay

## 2019-07-27 ENCOUNTER — Other Ambulatory Visit: Payer: Self-pay

## 2019-07-27 NOTE — Telephone Encounter (Signed)
Called patient to complete his Medicare visit. Patient did not trust who I was. He was convinced that I worked for [employer] and was [employer]. I explained to him who I was and the reason for this call and he still did not believe me. Appointment was cancelled and advised patient to discuss with his provider at next visit.

## 2019-08-04 ENCOUNTER — Other Ambulatory Visit: Payer: Self-pay | Admitting: Family Medicine

## 2019-08-04 MED ORDER — DIVALPROEX SODIUM ER 500 MG PO TB24: ORAL_TABLET | ORAL | 0 refills | Status: DC

## 2019-08-04 NOTE — Telephone Encounter (Signed)
Pt called needing a refill on his divalproex (DEPAKOTE ER) 500 MG 24 hr tablet sent in to the [facility] on [address]
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
## 2019-08-26 NOTE — Patient Instructions (Addendum)
We will continue divalproex ER 500mg  daily.   Follow up in 1 year    Seizure, Adult A seizure is a sudden burst of abnormal electrical activity in the brain. Seizures usually last from 30 seconds to 2 minutes. They can cause many different symptoms. Usually, seizures are not harmful unless they last a long time. What are the causes? Common causes of this condition include:  Fever or infection.  Conditions that affect the brain, such as: ? A brain abnormality that you were born with. ? A brain or head injury. ? Bleeding in the brain. ? A tumor. ? Stroke. ? Brain disorders such as autism or cerebral palsy.  Low blood sugar.  Conditions that are passed from parent to child (are inherited).  Problems with substances, such as: ? Having a reaction to a drug or a medicine. ? Suddenly stopping the use of a substance (withdrawal). In some cases, the cause may not be known. A person who has repeated seizures over time without a clear cause has a condition called epilepsy. What increases the risk? You are more likely to get this condition if you have:  A family history of epilepsy.  Had a seizure in the past.  A brain disorder.  A history of head injury, lack of oxygen at birth, or strokes. What are the signs or symptoms? There are many types of seizures. The symptoms vary depending on the type of seizure you have. Examples of symptoms during a seizure include:  Shaking (convulsions).  Stiffness in the body.  Passing out (losing consciousness).  Head nodding.  Staring.  Not responding to sound or touch.  Loss of bladder control and bowel control. Some people have symptoms right before and right after a seizure happens. Symptoms before a seizure may include:  Fear.  Worry (anxiety).  Feeling like you may vomit (nauseous).  Feeling like the room is spinning (vertigo).  Feeling like you saw or heard something before (dj vu).  Odd tastes or smells.  Changes  in how you see. You may see flashing lights or spots. Symptoms after a seizure happens can include:  Confusion.  Sleepiness.  Headache.  Weakness on one side of the body. How is this treated? Most seizures will stop on their own in under 5 minutes. In these cases, no treatment is needed. Seizures that last longer than 5 minutes will usually need treatment. Treatment can include:  Medicines given through an IV tube.  Avoiding things that are known to cause your seizures. These can include medicines that you take for another condition.  Medicines to treat epilepsy.  Surgery to stop the seizures. This may be needed if medicines do not help. Follow these instructions at home: Medicines  Take over-the-counter and prescription medicines only as told by your doctor.  Do not eat or drink anything that may keep your medicine from working, such as alcohol. Activity  Do not do any activities that would be dangerous if you had another seizure, like driving or swimming. Wait until your doctor says it is safe for you to do them.  If you live in the [place] ask your local DMV (department of motor vehicles) when you can drive.  Get plenty of rest. Teaching others Teach friends and family what to do when you have a seizure. They should:  Lay you on the ground.  Protect your head and body.  Loosen any tight clothing around your neck.  Turn you on your side.  Not hold you down.  Not put anything into your mouth.  Know whether or not you need emergency care.  Stay with you until you are better.  General instructions  Contact your doctor each time you have a seizure.  Avoid anything that gives you seizures.  Keep a seizure diary. Write down: ? What you think caused each seizure. ? What you remember about each seizure.  Keep all follow-up visits as told by your doctor. This is important. Contact a doctor if:  You have another seizure.  You have seizures more often.  There  is any change in what happens during your seizures.  You keep having seizures with treatment.  You have symptoms of being sick or having an infection. Get help right away if:  You have a seizure that: ? Lasts longer than 5 minutes. ? Is different than seizures you had before. ? Makes it harder to breathe. ? Happens after you hurt your head.  You have any of these symptoms after a seizure: ? Not being able to speak. ? Not being able to use a part of your body. ? Confusion. ? A bad headache.  You have two or more seizures in a row.  You do not wake up right after a seizure.  You get hurt during a seizure. These symptoms may be an emergency. Do not wait to see if the symptoms will go away. Get medical help right away. Call your local emergency services (911 in the [place] Do not drive yourself to the hospital. Summary  Seizures usually last from 30 seconds to 2 minutes. Usually, they are not harmful unless they last a long time.  Do not eat or drink anything that may keep your medicine from working, such as alcohol.  Teach friends and family what to do when you have a seizure.  Contact your doctor each time you have a seizure. This information is not intended to replace advice given to you by your health care provider. Make sure you discuss any questions you have with your health care provider. Document Revised: 03/14/2018 Document Reviewed: 03/14/2018 Elsevier Patient Education  [employer].

## 2019-08-27 ENCOUNTER — Ambulatory Visit: Payer: Medicare Other | Admitting: Family Medicine

## 2019-08-27 ENCOUNTER — Encounter: Payer: Self-pay | Admitting: Family Medicine

## 2019-08-27 ENCOUNTER — Other Ambulatory Visit: Payer: Self-pay

## 2019-08-27 VITALS — BP 120/79 | HR 49 | Ht 67.0 in | Wt 189.0 lb

## 2019-08-27 DIAGNOSIS — G40309 Generalized idiopathic epilepsy and epileptic syndromes, not intractable, without status epilepticus: Secondary | ICD-10-CM

## 2019-08-27 MED ORDER — DIVALPROEX SODIUM ER 500 MG PO TB24: ORAL_TABLET | ORAL | 3 refills | Status: DC

## 2019-09-03 ENCOUNTER — Other Ambulatory Visit: Payer: Self-pay | Admitting: Family Medicine

## 2019-09-03 DIAGNOSIS — G4700 Insomnia, unspecified: Secondary | ICD-10-CM

## 2019-09-03 MED ORDER — ZOLPIDEM TARTRATE 5 MG PO TABS: 5.0000 mg | ORAL_TABLET | Freq: Every evening | ORAL | 0 refills | Status: DC | PRN

## 2019-09-03 NOTE — Telephone Encounter (Signed)
Last office visit 06/26/2019 follow up visit. Last refill 04/28/18 #30. No upcoming visits with PCP.

## 2019-09-03 NOTE — Telephone Encounter (Signed)
Pt called wanting to know if debbie could  Call him in [place] he is having problems sleeping.  He stated you have prescribed this for him in the past  [facility]

## 2019-09-03 NOTE — Progress Notes (Signed)
I reviewed note and agree with plan.   Lacole Komorowski R. Alessandro Griep, MD 09/03/2019, 6:04 PM Certified in Neurology, Neurophysiology and Neuroimaging  [facility] [address], [address] [place], [place] [address] [phone]  

## 2019-09-12 ENCOUNTER — Other Ambulatory Visit: Payer: Self-pay | Admitting: Family Medicine

## 2019-09-12 DIAGNOSIS — N529 Male erectile dysfunction, unspecified: Secondary | ICD-10-CM

## 2019-09-15 NOTE — Telephone Encounter (Signed)
Received fax that Tadalafil 5 mg is not covered by patient's plan.  The preferred alternative is: Alfuzosin,Dutasteride, Finasteride, Silodosin and Tamsulosin.

## 2019-09-16 NOTE — Telephone Encounter (Signed)
[facility] notified via fax alternatives are not appropriate so patient can pay out of pocket for tadalafil if he still want medication.

## 2019-09-17 ENCOUNTER — Other Ambulatory Visit: Payer: Self-pay | Admitting: Family Medicine

## 2019-09-17 DIAGNOSIS — N529 Male erectile dysfunction, unspecified: Secondary | ICD-10-CM

## 2019-10-27 ENCOUNTER — Other Ambulatory Visit: Payer: Self-pay | Admitting: Family Medicine

## 2019-10-27 DIAGNOSIS — N529 Male erectile dysfunction, unspecified: Secondary | ICD-10-CM

## 2019-11-16 ENCOUNTER — Telehealth: Payer: Self-pay

## 2019-11-16 NOTE — Telephone Encounter (Signed)
Received fax from pharmacy that Tadalafil is not covered by insurance would like to know if you can change to another medication

## 2019-11-16 NOTE — Telephone Encounter (Signed)
Please call patient and tell him that it isn't usually covered by insurance, he can check with his insurance coverage and see if they cover sildenafil instead. Otherwise he can shop around for best price. Can look at [facility]. I can send the prescription to any [facility] pharmacy.

## 2019-11-17 NOTE — Telephone Encounter (Signed)
Called pt, his other insurance company covered the medication.

## 2019-12-06 ENCOUNTER — Other Ambulatory Visit: Payer: Self-pay | Admitting: Family Medicine

## 2019-12-06 DIAGNOSIS — G4700 Insomnia, unspecified: Secondary | ICD-10-CM

## 2019-12-07 NOTE — Telephone Encounter (Signed)
Pharmacy requests refill on: Zolpidem 5 mg   LAST REFILL: 09/03/2019 (Q-30, R-0) LAST OV: 06/26/2019 NEXT OV: Not Scheduled  PH[facility]ACY: [facility] [place], [place]

## 2019-12-11 ENCOUNTER — Telehealth: Payer: Self-pay | Admitting: Family Medicine

## 2019-12-11 NOTE — Telephone Encounter (Signed)
Error

## 2020-02-10 ENCOUNTER — Other Ambulatory Visit: Payer: Self-pay

## 2020-02-10 DIAGNOSIS — N529 Male erectile dysfunction, unspecified: Secondary | ICD-10-CM

## 2020-02-10 MED ORDER — TADALAFIL 5 MG PO TABS: 5.0000 mg | ORAL_TABLET | Freq: Every day | ORAL | 0 refills | Status: DC

## 2020-02-10 NOTE — Telephone Encounter (Signed)
ER #[id number]

## 2020-02-10 NOTE — Telephone Encounter (Signed)
Pharmacy requests refill on: Tadalafil 5 mg   LAST REFILL: 10/28/2019 (Q-90, R-0) LAST OV: 06/26/2019 NEXT OV: Not Scheduled  PH[facility]ACY: [facility] [place], [place]

## 2020-02-20 ENCOUNTER — Emergency Department (HOSPITAL_COMMUNITY): Payer: Medicare Other

## 2020-02-20 ENCOUNTER — Other Ambulatory Visit: Payer: Self-pay

## 2020-02-20 ENCOUNTER — Emergency Department (HOSPITAL_COMMUNITY)
Admission: EM | Admit: 2020-02-20 | Discharge: 2020-02-20 | Disposition: A | Discharge status: Home / Self Care | Payer: Medicare Other | Attending: Emergency Medicine | Admitting: Emergency Medicine

## 2020-02-20 ENCOUNTER — Encounter (HOSPITAL_COMMUNITY): Payer: Self-pay | Admitting: Emergency Medicine

## 2020-02-20 DIAGNOSIS — Z79899 Other long term (current) drug therapy: Secondary | ICD-10-CM | POA: Insufficient documentation

## 2020-02-20 DIAGNOSIS — U071 COVID-19: Secondary | ICD-10-CM | POA: Diagnosis not present

## 2020-02-20 DIAGNOSIS — Z96651 Presence of right artificial knee joint: Secondary | ICD-10-CM | POA: Diagnosis not present

## 2020-02-20 DIAGNOSIS — J45909 Unspecified asthma, uncomplicated: Secondary | ICD-10-CM | POA: Diagnosis not present

## 2020-02-20 DIAGNOSIS — I10 Essential (primary) hypertension: Secondary | ICD-10-CM | POA: Diagnosis not present

## 2020-02-20 DIAGNOSIS — Z87891 Personal history of nicotine dependence: Secondary | ICD-10-CM | POA: Insufficient documentation

## 2020-02-20 DIAGNOSIS — Z8551 Personal history of malignant neoplasm of bladder: Secondary | ICD-10-CM | POA: Diagnosis not present

## 2020-02-20 LAB — BASIC METABOLIC PANEL
Anion gap: 12 (ref 5–15)
BUN: 17 mg/dL (ref 8–23)
CO2: 22 mmol/L (ref 22–32)
Calcium: 9.1 mg/dL (ref 8.9–10.3)
Chloride: 102 mmol/L (ref 98–111)
Creatinine, Ser: 1.06 mg/dL (ref 0.61–1.24)
GFR, Estimated: >60 mL/min (ref >60)
Glucose, Bld: 92 mg/dL (ref 70–99)
Potassium: 3.8 mmol/L (ref 3.5–5.1)
Sodium: 136 mmol/L (ref 135–145)

## 2020-02-20 LAB — CBC
HCT: 42 % (ref 39.0–52.0)
Hemoglobin: 13.7 g/dL (ref 13.0–17.0)
MCH: 30.2 pg (ref 26.0–34.0)
MCHC: 32.6 g/dL (ref 30.0–36.0)
MCV: 92.5 fL (ref 80.0–100.0)
Platelets: 175 10*3/uL (ref 150–400)
RBC: 4.54 MIL/uL (ref 4.22–5.81)
RDW: 12.8 % (ref 11.5–15.5)
WBC: 8.2 10*3/uL (ref 4.0–10.5)
nRBC: 0 % (ref 0.0–0.2)

## 2020-02-20 MED ORDER — MOLNUPIRAVIR EUA 200MG CAPSULE: 4.0000 | ORAL_CAPSULE | Freq: Two times a day (BID) | ORAL | 0 refills | Status: AC

## 2020-02-20 NOTE — ED Provider Notes (Signed)
[facility] EMERGENCY DEPARTMENT Provider Note   CSN: [id number] Arrival date & time: 02/20/20  1025     History Chief Complaint  Patient presents with  . Covid Positive    JULIA KULZER is a 70 y.o. male.  70 year old male presents with Covid infection that was diagnosed by home test this morning.  Has had URI symptoms for a few days.  Denies any vomiting or diarrhea.  Denies any dyspnea on exertion.  Patient has had 2 doses of the vaccine was scheduled to have his booster in the near future.  Has no other complaints at this time        Past Medical History:  Diagnosis Date  . Asthma    mainly as a child  . Bladder cancer [facility])    Dr. Jeffie Pollock, [facility]  . High cholesterol   . Hyperlipidemia   . Hypertension    sees  dr.  Terrence Dupont  . Osteoarthritis of right knee 03/03/2012  . Seizures [facility])     Patient Active Problem List   Diagnosis Date Noted  . Cervical spondylosis without myelopathy 04/28/2018  . Lumbar pain 04/28/2018  . Neck pain 04/28/2018  . Spondylosis of lumbar region without myelopathy or radiculopathy 04/28/2018  . Lumbar spondylosis 04/28/2018  . Body mass index (bmi) 25.0-25.9, adult 01/16/2018  . 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 Encounter for long-term (current) use of other medications 11/17/2012  . Acute delirium 03/09/2012  . AKI (acute kidney injury) ([place]) 03/09/2012  . Acute blood loss anemia 03/09/2012  . Hyperlipidemia   . Seizures ([place])   . Asthma   . Hypertension   . High cholesterol   . Leg hematoma 03/07/2012  . Osteoarthritis of right knee 03/03/2012  . External hemorrhoids with irritation ( L lateral) 11/19/2011  . Bladder cancer [facility])     Past Surgical History:  Procedure Laterality Date  . ANAL FISSURE REPAIR  09/20/2011   Lat sphincterotomy  . BLADDER SURGERY  02/2005   TURBT   . COLONOSCOPY    . HEMORRHOID SURGERY  09/20/2011   Int hemorrhoidectomy x 2  . ROTATOR CUFF REPAIR   07/2009   right  . TOTAL KNEE ARTHROPLASTY Right 03/03/2012   Dr Mardelle Matte  . TOTAL KNEE ARTHROPLASTY Right 03/03/2012   Procedure: TOTAL KNEE ARTHROPLASTY;  Surgeon: Johnny Bridge, MD;  Location: [facility];  Service: Orthopedics;  Laterality: Right;       Family History  Problem Relation Age of Onset  . Cancer Mother        colon  . Cancer Father        colon/lung    Social History   Tobacco Use  . Smoking status: Former Smoker    Packs/day: 1.00    Years: 30.00    Pack years: 30.00    Types: Cigarettes    Quit date: 07/02/1986    Years since quitting: 33.6  . Smokeless tobacco: Never Used  Substance Use Topics  . Alcohol use: Yes    Alcohol/week: 2.0 standard drinks    Types: 1 Glasses of wine, 1 Cans of beer per week    Comment: occasionally  . Drug use: No    Home Medications Prior to Admission medications   Medication Sig Start Date End Date Taking? Authorizing Provider  zolpidem (AMBIEN) 5 MG tablet TAKE 1 TABLET(5 MG) BY MOUTH AT BEDTIME AS NEEDED FOR SLEEP. DO NOT TAKE WITHIN 8 HOURS OF TRAMADOL 12/07/19   Carlean Purl,  Dalbert Batman, FNP  CRESTOR 20 MG tablet Take 20 mg by mouth at bedtime.  05/20/11   [provider]  diclofenac (VOLTAREN) 50 MG EC tablet Take 1 tablet (50 mg total) by mouth 2 (two) times daily as needed. Take with food. 04/28/18   Elby Beck, FNP  divalproex (DEPAKOTE ER) 500 MG 24 hr tablet TAKE 1 TABLET DAILY AT NIGHT 08/27/19   Lomax, Amy, NP  escitalopram (LEXAPRO) 20 MG tablet Take 1 tablet (20 mg total) by mouth daily. 03/23/19   Elby Beck, FNP  hydrOXYzine (ATARAX/VISTARIL) 50 MG tablet Take 0.5-1 tablets (25-50 mg total) by mouth 3 (three) times daily as needed for anxiety. May cause drowsiness. 07/25/18   Elby Beck, FNP  tadalafil (CIALIS) 5 MG tablet Take 1 tablet (5 mg total) by mouth daily. 02/10/20   Ria Bush, MD  telmisartan (MICARDIS) 40 MG tablet TAKE 1 TABLET BY MOUTH EVERY MORNING 03/17/18   Elby Beck, FNP  traMADol (ULTRAM) 50 MG tablet Take 1-2 tablets (50-100 mg total) by mouth every 8 (eight) hours as needed. Do not take within 8 hours of taking Ambien. 04/28/18   Elby Beck, FNP    Allergies    Patient has no known allergies.  Review of Systems   Review of Systems  All other systems reviewed and are negative.   Physical Exam Updated Vital Signs BP 105/67   Pulse 86   Temp 99.2 F (37.3 C)   Resp 17   SpO2 94%   Physical Exam Vitals and nursing note reviewed.  Constitutional:      General: He is not in acute distress.    Appearance: Normal appearance. He is well-developed and well-nourished. He is not toxic-appearing.  HENT:     Head: Normocephalic and atraumatic.  Eyes:     General: Lids are normal.     Extraocular Movements: EOM normal.     Conjunctiva/sclera: Conjunctivae normal.     Pupils: Pupils are equal, round, and reactive to light.  Neck:     Thyroid: No thyroid mass.     Trachea: No tracheal deviation.  Cardiovascular:     Rate and Rhythm: Normal rate and regular rhythm.     Heart sounds: Normal heart sounds. No murmur heard. No gallop.   Pulmonary:     Effort: Pulmonary effort is normal. No respiratory distress.     Breath sounds: Normal breath sounds. No stridor. No decreased breath sounds, wheezing, rhonchi or rales.  Abdominal:     General: Bowel sounds are normal. There is no distension.     Palpations: Abdomen is soft.     Tenderness: There is no abdominal tenderness. There is no CVA tenderness or rebound.  Musculoskeletal:        General: No tenderness or edema. Normal range of motion.     Cervical back: Normal range of motion and neck supple.  Skin:    General: Skin is warm and dry.     Findings: No abrasion or rash.  Neurological:     Mental Status: He is alert and oriented to person, place, and time.     GCS: GCS eye subscore is 4. GCS verbal subscore is 5. GCS motor subscore is 6.     Cranial Nerves: No cranial  nerve deficit.     Sensory: No sensory deficit.     Deep Tendon Reflexes: Strength normal.  Psychiatric:        Mood and Affect: Mood and  affect normal.        Speech: Speech normal.        Behavior: Behavior normal.     ED Results / Procedures / Treatments   Labs (all labs ordered are listed, but only abnormal results are displayed) Labs Reviewed  CBC  BASIC METABOLIC PANEL  URINALYSIS, ROUTINE W REFLEX MICROSCOPIC    EKG None  Radiology DG Chest Portable 1 View  Result Date: 02/20/2020 CLINICAL DATA:  70 year old male with shortness of breath. Body aches. Positive at home test for COVID-19 this morning. Former smoker. EXAM: PORTABLE CHEST 1 VIEW COMPARISON:  Chest radiographs 02/27/2012 and earlier. FINDINGS: Portable AP semi upright view at 1106 hours. Lung volumes and mediastinal contours remain normal. Visualized tracheal air column is within normal limits. Mildly increased lung markings appear stable since 2014. No pneumothorax, pulmonary edema, pleural effusion or acute pulmonary opacity. Negative visible osseous structures. IMPRESSION: Negative portable chest. Electronically Signed   By: Genevie Ann M.D.   On: 02/20/2020 11:28    Procedures Procedures   Medications Ordered in ED Medications - No data to display  ED Course  I have reviewed the triage vital signs and the nursing notes.  Pertinent labs & imaging results that were available during my care of the patient were reviewed by me and considered in my medical decision making (see chart for details).    MDM Rules/Calculators/A&P                          Patient's labs and x-rays here are without acute findings.  Will place patient on molnupiravir as he is high risk.  Return precautions given Final Clinical Impression(s) / ED Diagnoses Final diagnoses:  None    Rx / DC Orders ED Discharge Orders    None       Lacretia Leigh, MD 02/20/20 1202

## 2020-02-20 NOTE — ED Triage Notes (Signed)
Pt reports headache, generalized body aches, and SOB x 2-3 days.  Took + at home COVID test this morning.

## 2020-03-31 ENCOUNTER — Telehealth: Payer: Self-pay | Admitting: Family Medicine

## 2020-03-31 DIAGNOSIS — G4700 Insomnia, unspecified: Secondary | ICD-10-CM

## 2020-03-31 NOTE — Telephone Encounter (Signed)
  LAST APPOINTMENT DATE: 02/10/2020   NEXT APPOINTMENT DATE:@Visit  date not found  MEDICATION: ambien 5mg   [facility] [facility] [place] [phone]  Let patient know to contact pharmacy at the end of the day to make sure medication is ready.  Please notify patient to allow 48-72 hours to process  Encourage patient to contact the pharmacy for refills or they can request refills through [place]:   LAST REFILL:  QTY:  REFILL DATE:    OTHER COMMENTS:    Okay for refill?  Please advise

## 2020-04-01 NOTE — Telephone Encounter (Signed)
Pharmacy requests refill on: Ambien 5 mg   LAST REFILL: 12/07/2019 (Q-30, R-0)  LAST OV:06/26/2019 NEXT OV: Not Scheduled  [facility]ACY[facility] [place], [place]

## 2020-04-03 MED ORDER — ZOLPIDEM TARTRATE 5 MG PO TABS: ORAL_TABLET | ORAL | 0 refills | Status: DC

## 2020-04-03 NOTE — Telephone Encounter (Signed)
Sent. Thanks.  Needs TOC appointment.

## 2020-05-09 ENCOUNTER — Other Ambulatory Visit: Payer: Self-pay | Admitting: Family Medicine

## 2020-05-09 ENCOUNTER — Other Ambulatory Visit: Payer: Self-pay | Admitting: Family

## 2020-05-09 DIAGNOSIS — N529 Male erectile dysfunction, unspecified: Secondary | ICD-10-CM

## 2020-05-09 DIAGNOSIS — G4700 Insomnia, unspecified: Secondary | ICD-10-CM

## 2020-05-09 NOTE — Telephone Encounter (Signed)
Pt reports he is out of town taking care of an ill family member and when he returns he will look into est care with a provider.... Ambien last filled 04/03/2020 and Tadalafil last filled 02/10/2020  Please advise send to [facility] in [place]

## 2020-05-09 NOTE — Telephone Encounter (Signed)
Dr Damita Dunnings can you please see if this will go through electronically for you to [facility] in [place]... You last filled 04/03/2020

## 2020-05-10 MED ORDER — ZOLPIDEM TARTRATE 5 MG PO TABS: ORAL_TABLET | ORAL | 0 refills | Status: DC

## 2020-05-10 NOTE — Telephone Encounter (Signed)
Sent. Thanks.   

## 2020-05-11 ENCOUNTER — Telehealth: Payer: Self-pay

## 2020-05-11 MED ORDER — SILDENAFIL CITRATE 100 MG PO TABS: 50.0000 mg | ORAL_TABLET | Freq: Every day | ORAL | 11 refills | Status: DC | PRN

## 2020-05-11 NOTE — Telephone Encounter (Signed)
Received fax from [employer] stating that Tadalafil 5 mg is not covered by the patients insurance. Preferred alternatives are: Alfuzosinhcler, Doxazosinmesylate, Finasteride, Tamsulosin HCL. Please advise.

## 2020-05-11 NOTE — Addendum Note (Signed)
Addended by: Dutch Quint B on: 05/11/2020 11:20 PM   Modules accepted: Orders

## 2020-06-21 NOTE — Telephone Encounter (Addendum)
Spoke with patient and he states he is unable to schedule an appointment due to being stuck in [place] for the last 3 months, and is unable to travel due to lost license. Tylyn is wondering if we can transfer the script to his preferred pharmacy in [place].

## 2020-06-22 NOTE — Telephone Encounter (Signed)
This was sent to pharmacy on 05/10/20 [address]  [place], [place] [address].   LVM reporting refill was sent to [facility] on 5/3 per HIPPA

## 2020-07-01 ENCOUNTER — Telehealth: Payer: Self-pay | Admitting: Family Medicine

## 2020-07-01 DIAGNOSIS — G4700 Insomnia, unspecified: Secondary | ICD-10-CM

## 2020-07-01 NOTE — Telephone Encounter (Signed)
Ambien last refilled 05/10/20 #30/0

## 2020-07-01 NOTE — Telephone Encounter (Signed)
  LAST APPOINTMENT DATE: 05/11/2020   NEXT APPOINTMENT DATE:@Visit  date not found  MEDICATION: ambien  PHARMACY: [facility] [phone] - [place], [place] - [address] AT [place], [place]   Let patient know to contact pharmacy at the end of the day to make sure medication is ready.  Please notify patient to allow 48-72 hours to process  Encourage patient to contact the pharmacy for refills or they can request refills through [place]:   LAST REFILL:  QTY:  REFILL DATE:    OTHER COMMENTS:    Okay for refill?  Please advise

## 2020-07-03 MED ORDER — ZOLPIDEM TARTRATE 5 MG PO TABS: ORAL_TABLET | ORAL | 0 refills | Status: DC

## 2020-07-03 NOTE — Addendum Note (Signed)
Addended by: Tonia Ghent on: 07/03/2020 04:52 PM   Modules accepted: Orders

## 2020-07-03 NOTE — Telephone Encounter (Signed)
Sent. Thanks.   
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
## 2020-08-31 NOTE — Patient Instructions (Signed)
Below is our plan:  We will continue divalproex ER '500mg'$  daily.   Please make sure you are staying well hydrated. I recommend 50-60 ounces daily. Well balanced diet and regular exercise encouraged. Consistent sleep schedule with 6-8 hours recommended.   Please continue follow up with care team as directed.   Follow up with me in 1 year   You may receive a survey regarding today's visit. I encourage you to leave honest feed back as I do use this information to improve patient care. Thank you for seeing me today!

## 2020-09-01 ENCOUNTER — Encounter: Payer: Self-pay | Admitting: Family Medicine

## 2020-09-01 ENCOUNTER — Ambulatory Visit: Payer: Medicare Other | Admitting: Family Medicine

## 2020-09-01 VITALS — BP 118/84 | HR 60 | Ht 69.0 in | Wt 178.0 lb

## 2020-09-01 DIAGNOSIS — G40309 Generalized idiopathic epilepsy and epileptic syndromes, not intractable, without status epilepticus: Secondary | ICD-10-CM | POA: Diagnosis not present

## 2020-09-01 MED ORDER — DIVALPROEX SODIUM ER 500 MG PO TB24: ORAL_TABLET | ORAL | 3 refills | Status: DC

## 2020-09-02 LAB — VALPROIC ACID LEVEL: Valproic Acid Lvl: 37 ug/mL — ABNORMAL LOW (ref 50–100)

## 2020-12-08 ENCOUNTER — Telehealth: Payer: Self-pay

## 2020-12-08 NOTE — Telephone Encounter (Signed)
I spoke with pt; pt has not established with a provider yet because he is in [place] and has been going back and forth to [facility]. Pt will decide if he wants to do TOC to another NP at [facility] or go to another office closer to pt. Pt will cb if wants to schedule TOC or pt will schedule at different office. Pt understands last seen by Glenda Chroman FNP on 06/26/2019 and does need appt to FU on PA for Tadalafil. Nothing further needed at this time. Will send to [employer] for review to verify this is appropriate. Pt does not need cb.

## 2021-06-13 ENCOUNTER — Ambulatory Visit: Payer: Medicare Other | Admitting: Family Medicine

## 2021-09-04 NOTE — Patient Instructions (Incomplete)
Below is our plan:  We will continue divalproex ER '500mg'$  daily. Please send me updated labs when they are completed.   Please make sure you are consistent with timing of seizure medication. I recommend annual visit with primary care provider (PCP) for complete physical and routine blood work. I recommend daily intake of vitamin D (400-800iu) and calcium (800-'1000mg'$ ) for bone health. Discuss Dexa screening with PCP.   According to [place] law, you can not drive unless you are seizure / syncope free for at least 6 months and under physician's care.  Please maintain precautions. Do not participate in activities where a loss of awareness could harm you or someone else. No swimming alone, no tub bathing, no hot tubs, no driving, no operating motorized vehicles (cars, ATVs, motocycles, etc), lawnmowers, power tools or firearms. No standing at heights, such as rooftops, ladders or stairs. Avoid hot objects such as stoves, heaters, open fires. Wear a helmet when riding a bicycle, scooter, skateboard, etc. and avoid areas of traffic. Set your water heater to 120 degrees or less.  Please make sure you are staying well hydrated. I recommend 50-60 ounces daily. Well balanced diet and regular exercise encouraged. Consistent sleep schedule with 6-8 hours recommended.   Please continue follow up with care team as directed.   Follow up with me in 1 year   You may receive a survey regarding today's visit. I encourage you to leave honest feed back as I do use this information to improve patient care. Thank you for seeing me today!

## 2021-09-04 NOTE — Progress Notes (Unsigned)
PATIENT: Todd Howell DOB: [birth year]  REASON FOR VISIT: follow up HISTORY FROM: patient  Virtual Visit via Telephone Note  I connected with Todd Howell on 09/05/21 at  9:00 AM EDT by telephone and verified that I am speaking with the correct person using two identifiers.   I discussed the limitations, risks, security and privacy concerns of performing an evaluation and management service by telephone and the availability of in person appointments. I also discussed with the patient that there may be a patient responsible charge related to this service. The patient expressed understanding and agreed to proceed.   History of Present Illness:  09/05/21 ALL ([place]): Todd Howell is a 71 y.o. male here today for follow up for seizures. He continues divalproex ER '500mg'$  daily. He is tolerating it well and denies recent seizure activity. He has not had updated blood work but has follow up scheduled with PCP.   09/01/20 ALL: Todd Howell returns for follow up for seizures. He continues divalproex ER '500mg'$  daily. He is tolerating well. No seizure activity. He reports doing very well. He continues to spend most of his time in [place].    08/27/2019 ALL:  Todd Howell is a 71 y.o. male here today for follow up for seizures. He continues divalproex ER '500mg'$  daily. He is doing well. He has been spending more time in [place]. He does go to [place] frequently to care for his friend (not mother as previously documented). He is tolerating medication well. No seizures. He is seen regularly by PCP who graciously updates labs annually. He is feeling well and without concerns.    Observations/Objective:  Generalized: Well developed, in no acute distress  Mentation: Alert oriented to time, place, history taking. Follows all commands speech and language fluent   Assessment and Plan:  71 y.o. year old male  has a past medical history of Asthma, Bladder cancer ([place]), High cholesterol, Hyperlipidemia,  Hypertension, Osteoarthritis of right knee (03/03/2012), and Seizures ([place]). here with    ICD-10-CM   1. Generalized tonic clonic epilepsy ([place])  G40.309       Amond continues to do very well. We will continue divalproex ER '500mg'$  daily. He will send me a copy of his labs once completed with PCP. He was encouraged to continue healthy lifestyle habits. Seizure precautions advised. He will follow up in 1 year.   No orders of the defined types were placed in this encounter.   Meds ordered this encounter  Medications   divalproex (DEPAKOTE ER) 500 MG 24 hr tablet    Sig: TAKE 1 TABLET DAILY AT NIGHT    Dispense:  90 tablet    Refill:  0    Order Specific Question:   Supervising Provider    Answer:   Melvenia Beam [[id number]]     Follow Up Instructions:  I discussed the assessment and treatment plan with the patient. The patient was provided an opportunity to ask questions and all were answered. The patient agreed with the plan and demonstrated an understanding of the instructions.   The patient was advised to call back or seek an in-person evaluation if the symptoms worsen or if the condition fails to improve as anticipated.  I provided 18 minutes of non-face-to-face time during this encounter. Patient located at their place of residence during [place] visit. Patient was able to log into Mychart visit but never able to connect through video. Transitioned to televisit. Provider is in the office.    Shelanda Duvall  Maylin [place], NP

## 2021-09-05 ENCOUNTER — Encounter: Payer: Self-pay | Admitting: Family Medicine

## 2021-09-05 ENCOUNTER — Telehealth (INDEPENDENT_AMBULATORY_CARE_PROVIDER_SITE_OTHER): Payer: Medicare Other | Admitting: Family Medicine

## 2021-09-05 DIAGNOSIS — G40309 Generalized idiopathic epilepsy and epileptic syndromes, not intractable, without status epilepticus: Secondary | ICD-10-CM | POA: Diagnosis not present

## 2021-09-05 MED ORDER — DIVALPROEX SODIUM ER 500 MG PO TB24: ORAL_TABLET | ORAL | 0 refills | Status: DC

## 2021-10-02 ENCOUNTER — Encounter: Payer: Self-pay | Admitting: *Deleted

## 2021-12-21 ENCOUNTER — Encounter: Payer: Self-pay | Admitting: *Deleted

## 2021-12-25 ENCOUNTER — Other Ambulatory Visit: Payer: Self-pay

## 2021-12-25 MED ORDER — DIVALPROEX SODIUM ER 500 MG PO TB24: ORAL_TABLET | ORAL | 0 refills | Status: DC

## 2022-01-02 ENCOUNTER — Other Ambulatory Visit: Payer: Self-pay | Admitting: *Deleted

## 2022-01-02 MED ORDER — DIVALPROEX SODIUM ER 500 MG PO TB24: ORAL_TABLET | ORAL | 1 refills | Status: DC

## 2022-01-04 ENCOUNTER — Encounter (HOSPITAL_BASED_OUTPATIENT_CLINIC_OR_DEPARTMENT_OTHER): Payer: Self-pay | Admitting: Family Medicine

## 2022-01-04 ENCOUNTER — Ambulatory Visit (HOSPITAL_BASED_OUTPATIENT_CLINIC_OR_DEPARTMENT_OTHER): Payer: Medicare Other | Admitting: Family Medicine

## 2022-01-04 DIAGNOSIS — I10 Essential (primary) hypertension: Secondary | ICD-10-CM | POA: Diagnosis not present

## 2022-01-04 DIAGNOSIS — G8929 Other chronic pain: Secondary | ICD-10-CM

## 2022-01-04 DIAGNOSIS — E785 Hyperlipidemia, unspecified: Secondary | ICD-10-CM

## 2022-01-04 DIAGNOSIS — G4700 Insomnia, unspecified: Secondary | ICD-10-CM | POA: Insufficient documentation

## 2022-01-04 MED ORDER — ZOLPIDEM TARTRATE 5 MG PO TABS: 5.0000 mg | ORAL_TABLET | Freq: Every evening | ORAL | 0 refills | Status: DC | PRN

## 2022-01-04 NOTE — Assessment & Plan Note (Signed)
Blood pressure controlled in office today.  Also controlled reportedly at home.  Can continue with current regimen, no changes to be made today

## 2022-01-04 NOTE — Assessment & Plan Note (Signed)
Will refer to pain management provider locally so the patient may transition care from [place] to here.

## 2022-01-04 NOTE — Assessment & Plan Note (Signed)
Tolerating current regimen with Crestor, can continue with this, no changes to be made today in regards to medication management

## 2022-01-04 NOTE — Assessment & Plan Note (Signed)
Has been utilizing Ambien as needed.  PDMP reviewed today, no red flags.  Can allow for refill of prescriptions today, sent to pharmacy on file.  Patient aware of potential risks and side effects

## 2022-01-04 NOTE — Progress Notes (Signed)
New Patient Office Visit  Subjective    Patient ID: Todd Howell, male    DOB: 1950/08/17  Age: 71 y.o. MRN: [id number]  CC:  Chief Complaint  Patient presents with   New Patient (Initial Visit)    Pt here to establish new care, pt requesting a refill on his Ambien if possible stated he can't sleep sometimes     HPI Todd Howell presents to establish care Last PCP - Clarene Reamer - [facility], last visit more than 2 years ago.  HLD: Currently taking Crestor 20 mg, has been taking this for some time now.  Denies any issues with medication.  She does have cardiologist that he follows with regularly.  HTN: Blood pressure tends to be well-controlled at home.  Currently manages with home losartan.  No recent changes to medications.  Denies any issues with chest pain or headaches.  ED: Symptoms managed with tadalafil, feels that this does help control symptoms adequately  Insomnia: Has used Ambien as needed in the past.  Reports he generally uses this infrequently.  He is requesting refill of this today.  Chronic pain: Reports having chronic pain related to prior surgeries primarily.  Has had surgical interventions on knee, back, shoulder.  He generally has been following with pain management specialist in [place] who has prescribed Norco to help with pain control.  He is requesting referral to pain management specialist to establish with provider locally now that he will be spending more time in [place] as opposed to [place].  Patient is originally from [place]. Patient lives both here and [place]. Patient enjoys hiking, he played football in high school.  Outpatient Encounter Medications as of 01/04/2022  Medication Sig   CRESTOR 20 MG tablet Take 20 mg by mouth at bedtime.    diclofenac (VOLTAREN) 50 MG EC tablet Take 1 tablet (50 mg total) by mouth 2 (two) times daily as needed. Take with food.   divalproex (DEPAKOTE ER) 500 MG 24 hr tablet TAKE 1 TABLET DAILY  AT NIGHT   HYDROcodone-acetaminophen (NORCO) 10-325 MG tablet Take 1 tablet by mouth every 8 (eight) hours as needed.   tadalafil (CIALIS) 5 MG tablet Take 1 tablet (5 mg total) by mouth daily.   telmisartan (MICARDIS) 40 MG tablet TAKE 1 TABLET BY MOUTH EVERY MORNING   [DISCONTINUED] hydrOXYzine (ATARAX/VISTARIL) 50 MG tablet Take 0.5-1 tablets (25-50 mg total) by mouth 3 (three) times daily as needed for anxiety. May cause drowsiness.   [DISCONTINUED] zolpidem (AMBIEN) 5 MG tablet TAKE 1 TABLET(5 MG) BY MOUTH AT BEDTIME AS NEEDED FOR SLEEP. DO NOT TAKE WITHIN 8 HOURS OF TRAMADOL   zolpidem (AMBIEN) 5 MG tablet Take 1 tablet (5 mg total) by mouth at bedtime as needed for sleep.   [DISCONTINUED] sildenafil (VIAGRA) 100 MG tablet Take 0.5-1 tablets (50-100 mg total) by mouth daily as needed for erectile dysfunction.   No facility-administered encounter medications on file as of 01/04/2022.    Past Medical History:  Diagnosis Date   Asthma    mainly as a child   Bladder cancer ([place])    Dr. Jeffie Pollock, [facility]   High cholesterol    Hyperlipidemia    Hypertension    sees  dr.  Terrence Dupont   Osteoarthritis of right knee 03/03/2012   Seizures [facility])     Past Surgical History:  Procedure Laterality Date   ANAL FISSURE REPAIR  09/20/2011   Lat sphincterotomy   BLADDER SURGERY  02/2005  TURBT    COLONOSCOPY     HEMORRHOID SURGERY  09/20/2011   Int hemorrhoidectomy x 2   ROTATOR CUFF REPAIR  07/2009   right   TOTAL KNEE ARTHROPLASTY Right 03/03/2012   Dr Mardelle Matte   TOTAL KNEE ARTHROPLASTY Right 03/03/2012   Procedure: TOTAL KNEE ARTHROPLASTY;  Surgeon: Johnny Bridge, MD;  Location: [facility];  Service: Orthopedics;  Laterality: Right;    Family History  Problem Relation Age of Onset   Cancer Mother        colon   Cancer Father        colon/lung    Social History   Socioeconomic History   Marital status: Significant Other    Spouse name: [name]   Number of children: 5   Years of  education: 11   Highest education level: Not on file  Occupational History   Not on file  Tobacco Use   Smoking status: Former    Packs/day: 1.00    Years: 30.00    Total pack years: 30.00    Types: Cigarettes    Quit date: 07/02/1986    Years since quitting: 35.5   Smokeless tobacco: Never  Substance and Sexual Activity   Alcohol use: Yes    Alcohol/week: 2.0 standard drinks of alcohol    Types: 1 Glasses of wine, 1 Cans of beer per week    Comment: occasionally   Drug use: No   Sexual activity: Not on file  Other Topics Concern   Not on file  Social History Narrative   Patient is married([name]) and lives with his wife.   Patient has 5 children.   Patient is retired.   Patient is right-handed.   Patient has a 11 grade education.   Patient drinks 2-3 cups of caffeine daily.   Social Determinants of Health   Financial Resource Strain: Not on file  Food Insecurity: Not on file  Transportation Needs: Not on file  Physical Activity: Not on file  Stress: Not on file  Social Connections: Not on file  Intimate Partner Violence: Not on file    Objective    BP 117/80 (BP Location: Right Arm, Patient Position: Sitting, Cuff Size: Large)   Pulse 61   Ht '5\' 9"'$  (1.753 m)   Wt 172 lb 8 oz (78.2 kg)   SpO2 99%   BMI 25.47 kg/m   Physical Exam  71 year old male in no acute distress Cardiovascular exam regular rate and rhythm, no murmur appreciated Lungs clear to auscultation bilaterally  Assessment & Plan:   Problem List Items Addressed This Visit       Cardiovascular and Mediastinum   Hypertension    Blood pressure controlled in office today.  Also controlled reportedly at home.  Can continue with current regimen, no changes to be made today        Other   Hyperlipidemia    Tolerating current regimen with Crestor, can continue with this, no changes to be made today in regards to medication management      Chronic pain - Primary    Will refer to pain  management provider locally so the patient may transition care from [place] to here.      Relevant Medications   HYDROcodone-acetaminophen (NORCO) 10-325 MG tablet   Other Relevant Orders   Ambulatory referral to Pain Clinic   Insomnia    Has been utilizing Ambien as needed.  PDMP reviewed today, no red flags.  Can allow for refill of prescriptions today, sent  to pharmacy on file.  Patient aware of potential risks and side effects      Relevant Medications   zolpidem (AMBIEN) 5 MG tablet    Return in about 3 months (around 04/05/2022) for AWV.   Rosealyn Little J [place], MD

## 2022-02-19 ENCOUNTER — Telehealth (HOSPITAL_BASED_OUTPATIENT_CLINIC_OR_DEPARTMENT_OTHER): Payer: Self-pay | Admitting: Family Medicine

## 2022-02-19 IMAGING — DX DG CHEST 1V PORT
1 series · 1 of 1 positions shown · non-contrast
Comparison: Chest radiographs 02/27/2012 and earlier.

CLINICAL DATA: 69-year-old male with shortness of breath. Body
aches. Positive at home test for [id number] this morning. Former
smoker.

EXAM:
PORTABLE CHEST 1 VIEW

[chest]
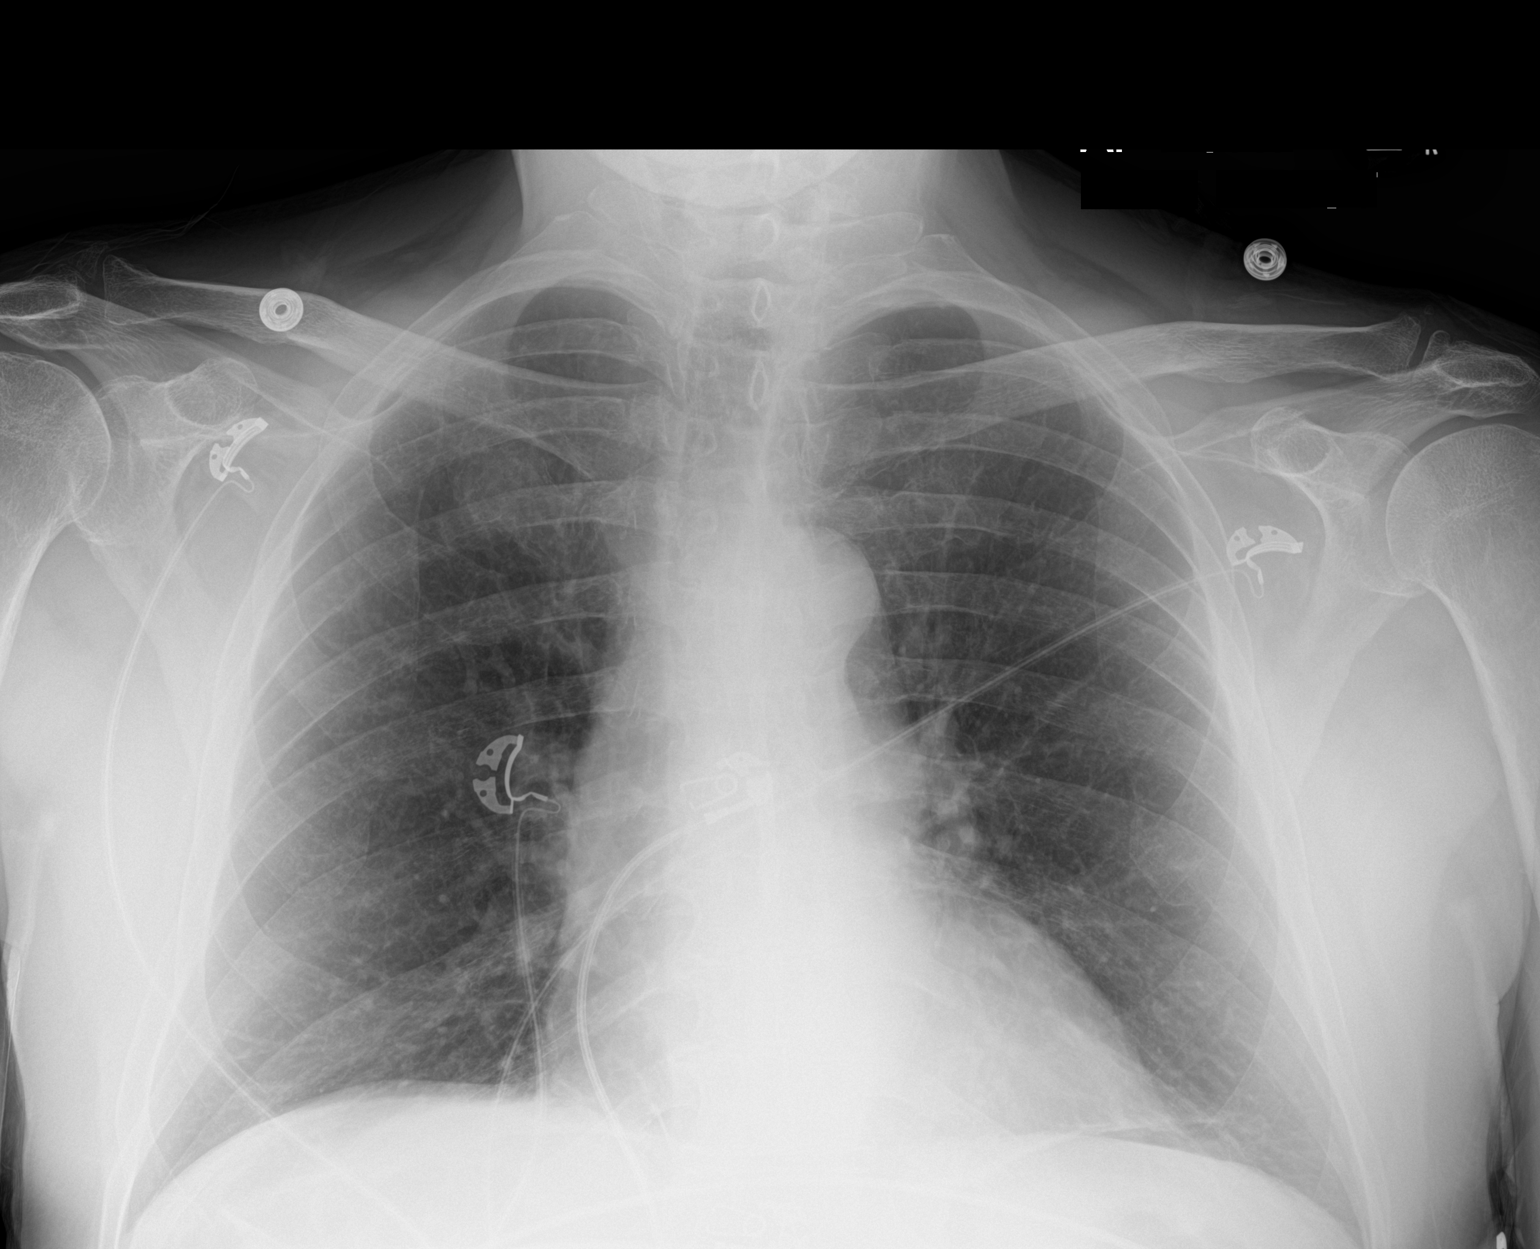

[1 of 1 positions shown; findings below may reference images not displayed]

FINDINGS: Portable AP semi upright view at 9969 hours. Lung volumes and
mediastinal contours remain normal. Visualized tracheal air column
is within normal limits. Mildly increased lung markings appear
stable since 8719. No pneumothorax, pulmonary edema, pleural
effusion or acute pulmonary opacity. Negative visible osseous
structures.
IMPRESSION: Negative portable chest.

## 2022-02-19 NOTE — Telephone Encounter (Signed)
Pt is advised NO to the FLU Vaccine

## 2022-02-27 ENCOUNTER — Telehealth (HOSPITAL_BASED_OUTPATIENT_CLINIC_OR_DEPARTMENT_OTHER): Payer: Self-pay | Admitting: Family Medicine

## 2022-02-27 NOTE — Telephone Encounter (Signed)
Contacted Todd Howell to schedule their annual wellness visit. Patient declined to schedule AWV at this time.  Patient's going to [place].  Patient asked me to call him back in March to schedule AWV.  Thank you,  [place] Direct dial  [phone]

## 2022-02-27 NOTE — Telephone Encounter (Signed)
This one is not showing updated

## 2022-03-05 ENCOUNTER — Telehealth: Payer: Self-pay | Admitting: Family Medicine

## 2022-03-05 NOTE — Telephone Encounter (Signed)
Contacted Todd Howell to schedule their annual wellness visit. Call back at later date: 04/09/2022  Patient's leaving for trip to [place] tomorrow.  Thank you,  [place] Direct dial  [phone]

## 2022-03-21 ENCOUNTER — Encounter (HOSPITAL_BASED_OUTPATIENT_CLINIC_OR_DEPARTMENT_OTHER): Payer: Self-pay

## 2022-03-27 ENCOUNTER — Ambulatory Visit (INDEPENDENT_AMBULATORY_CARE_PROVIDER_SITE_OTHER): Payer: Medicare Other

## 2022-03-27 ENCOUNTER — Other Ambulatory Visit: Payer: Self-pay

## 2022-03-27 ENCOUNTER — Encounter (HOSPITAL_BASED_OUTPATIENT_CLINIC_OR_DEPARTMENT_OTHER): Payer: Self-pay

## 2022-03-27 VITALS — Ht 69.0 in | Wt 171.0 lb

## 2022-03-27 DIAGNOSIS — Z0000 Encounter for general adult medical examination without abnormal findings: Secondary | ICD-10-CM

## 2022-03-27 MED ORDER — DIVALPROEX SODIUM ER 500 MG PO TB24: ORAL_TABLET | ORAL | 0 refills | Status: DC

## 2022-03-27 NOTE — Progress Notes (Signed)
Subjective:   Todd Howell is a 72 y.o. male who presents for Medicare Annual/Subsequent preventive examination.  Review of Systems    Virtual Visit via Telephone Note  I connected with  Todd Howell on 03/27/22 at  8:45 AM EDT by telephone and verified that I am speaking with the correct person using two identifiers.  Location: Patient: Home Provider: Office Persons participating in the virtual visit: patient/Nurse Health Advisor   I discussed the limitations, risks, security and privacy concerns of performing an evaluation and management service by telephone and the availability of in person appointments. The patient expressed understanding and agreed to proceed.  Interactive audio and video telecommunications were attempted between this nurse and patient, however failed, due to patient having technical difficulties OR patient did not have access to video capability.  We continued and completed visit with audio only.  Some vital signs may be absent or patient reported.   Criselda Peaches, LPN  Cardiac Risk Factors include: advanced age (>55men, >25 women);male gender;hypertension     Objective:    Today's Vitals   03/27/22 0856  Weight: 171 lb (77.6 kg)  Height: 5\' 9"  (1.753 m)   Body mass index is 25.25 kg/m.     03/27/2022    9:04 AM 09/19/2016    2:13 PM 04/11/2015    1:08 PM 03/04/2012    8:40 AM 02/27/2012   12:54 PM  Advanced Directives  Does Patient Have a Medical Advance Directive? No No No Patient does not have advance directive Patient would like information  Would patient like information on creating a medical advance directive? No - Patient declined Yes (MAU/Ambulatory/Procedural Areas - Information given) Yes - Educational materials given    Pre-existing out of facility DNR order (yellow form or pink MOST form)    No     Current Medications (verified) Outpatient Encounter Medications as of 03/27/2022  Medication Sig   CRESTOR 20 MG tablet Take 20 mg  by mouth at bedtime.    diclofenac (VOLTAREN) 50 MG EC tablet Take 1 tablet (50 mg total) by mouth 2 (two) times daily as needed. Take with food.   HYDROcodone-acetaminophen (NORCO) 10-325 MG tablet Take 1 tablet by mouth every 8 (eight) hours as needed.   tadalafil (CIALIS) 5 MG tablet Take 1 tablet (5 mg total) by mouth daily.   telmisartan (MICARDIS) 40 MG tablet TAKE 1 TABLET BY MOUTH EVERY MORNING   zolpidem (AMBIEN) 5 MG tablet Take 1 tablet (5 mg total) by mouth at bedtime as needed for sleep.   [DISCONTINUED] divalproex (DEPAKOTE ER) 500 MG 24 hr tablet TAKE 1 TABLET DAILY AT NIGHT   No facility-administered encounter medications on file as of 03/27/2022.    Allergies (verified) Patient has no known allergies.   History: Past Medical History:  Diagnosis Date   Asthma    mainly as a child   Bladder cancer ([place])    Dr. Jeffie Pollock, [facility]   High cholesterol    Hyperlipidemia    Hypertension    sees  dr.  Terrence Dupont   Osteoarthritis of right knee 03/03/2012   Seizures [facility])    Past Surgical History:  Procedure Laterality Date   ANAL FISSURE REPAIR  09/20/2011   Lat sphincterotomy   BLADDER SURGERY  02/2005   TURBT    COLONOSCOPY     HEMORRHOID SURGERY  09/20/2011   Int hemorrhoidectomy x 2   ROTATOR CUFF REPAIR  07/2009   right   TOTAL KNEE  ARTHROPLASTY Right 03/03/2012   Dr Mardelle Matte   TOTAL KNEE ARTHROPLASTY Right 03/03/2012   Procedure: TOTAL KNEE ARTHROPLASTY;  Surgeon: Johnny Bridge, MD;  Location: [facility];  Service: Orthopedics;  Laterality: Right;   Family History  Problem Relation Age of Onset   Cancer Mother        colon   Cancer Father        colon/lung   Social History   Socioeconomic History   Marital status: Significant Other    Spouse name: [name]   Number of children: 5   Years of education: 11   Highest education level: Not on file  Occupational History   Not on file  Tobacco Use   Smoking status: Former    Packs/day: 1.00    Years: 30.00     Additional pack years: 0.00    Total pack years: 30.00    Types: Cigarettes    Quit date: 07/02/1986    Years since quitting: 35.7   Smokeless tobacco: Never  Substance and Sexual Activity   Alcohol use: Yes    Alcohol/week: 2.0 standard drinks of alcohol    Types: 1 Glasses of wine, 1 Cans of beer per week    Comment: occasionally   Drug use: No   Sexual activity: Not on file  Other Topics Concern   Not on file  Social History Narrative   Patient is married([name]) and lives with his wife.   Patient has 5 children.   Patient is retired.   Patient is right-handed.   Patient has a 11 grade education.   Patient drinks 2-3 cups of caffeine daily.   Social Determinants of Health   Financial Resource Strain: Low Risk  (03/27/2022)   Overall Financial Resource Strain (CARDIA)    Difficulty of Paying Living Expenses: Not hard at all  Food Insecurity: No Food Insecurity (03/27/2022)   Hunger Vital Sign    Worried About Running Out of Food in the Last Year: Never true    Ran Out of Food in the Last Year: Never true  Transportation Needs: No Transportation Needs (03/27/2022)   PRAPARE - Hydrologist (Medical): No    Lack of Transportation (Non-Medical): No  Physical Activity: Inactive (03/27/2022)   Exercise Vital Sign    Days of Exercise per Week: 0 days    Minutes of Exercise per Session: 0 min  Stress: No Stress Concern Present (03/27/2022)   [place]    Feeling of Stress : Not at all  Social Connections: Moderately Isolated (03/27/2022)   Social Connection and Isolation Panel [NHANES]    Frequency of Communication with Friends and Family: More than three times a week    Frequency of Social Gatherings with Friends and Family: More than three times a week    Attends Religious Services: Never    Marine scientist or Organizations: No    Attends Music therapist:  Never    Marital Status: Living with partner    Tobacco Counseling Counseling given: Not Answered   Clinical Intake:  Pre-visit preparation completed: Yes  Pain : No/denies pain     BMI - recorded: 26.43 Nutritional Status: BMI 25 -29 Overweight Nutritional Risks: None Diabetes: No  How often do you need to have someone help you when you read instructions, pamphlets, or other written materials from your doctor or pharmacy?: 1 - Never  Diabetic?  No  Interpreter Needed?: No  Information entered by :: Rolene Arbour LPN   Activities of Daily Living    03/27/2022    9:03 AM 01/04/2022   10:14 AM  In your present state of health, do you have any difficulty performing the following activities:  Hearing? 0 0  Vision? 0 0  Difficulty concentrating or making decisions? 0 0  Walking or climbing stairs? 0 0  Dressing or bathing? 0 0  Doing errands, shopping? 0 0  Preparing Food and eating ? N   Using the Toilet? N   In the past six months, have you accidently leaked urine? N   Do you have problems with loss of bowel control? N   Managing your Medications? N   Managing your Finances? N   Housekeeping or managing your Housekeeping? N     Patient Care Team: [place], Blondell Reveal, MD as PCP - General (Family Medicine)  Indicate any recent Medical Services you may have received from other than [facility] providers in the past year (date may be approximate).     Assessment:   This is a routine wellness examination for [place].  Hearing/Vision screen Hearing Screening - Comments:: Denies hearing difficulties   Vision Screening - Comments:: Wears rx glasses - up to date with routine eye exams with  Patient deferred  Dietary issues and exercise activities discussed: Current Exercise Habits: The patient does not participate in regular exercise at present, Exercise limited by: None identified   Goals Addressed               This Visit's Progress     No current goals  (pt-stated)         Depression Screen    03/27/2022    9:02 AM 01/04/2022   10:14 AM 06/26/2019    3:13 PM 07/25/2018   11:37 AM 09/19/2016    2:19 PM 03/14/2016    2:07 PM  PHQ 2/9 Scores  PHQ - 2 Score 0 0 3 6 0 0  PHQ- 9 Score  0 7 18    Exception Documentation  Medical reason        Fall Risk    03/27/2022    9:04 AM 01/04/2022   10:14 AM 09/19/2016    2:19 PM 03/14/2016    2:07 PM 04/11/2015    1:05 PM  [place] in the past year? 0 0 No No No  Number falls in past yr: 0 0     Injury with Fall? 0 0     Risk for fall due to : No Fall Risks No Fall Risks     Follow up Falls prevention discussed Falls evaluation completed       FALL RISK PREVENTION PERTAINING TO THE HOME:  Any stairs in or around the home? No  If so, are there any without handrails? No  Home free of loose throw rugs in walkways, pet beds, electrical cords, etc? Yes  Adequate lighting in your home to reduce risk of falls? Yes   ASSISTIVE DEVICES UTILIZED TO PREVENT FALLS:  Life alert? No  Use of a cane, walker or w/c? No  Grab bars in the bathroom? No  Shower chair or bench in shower? No  Elevated toilet seat or a handicapped toilet? No   TIMED UP AND GO:  Was the test performed? No . Audio Visit   Cognitive Function:        03/27/2022    9:05 AM  6CIT Screen  What Year? 0 points  What month? 0 points  What time? 0 points  Count back from 20 0 points  Months in reverse 0 points  Repeat phrase 0 points  Total Score 0 points    Immunizations Immunization History  Administered Date(s) Administered   PFIZER(Purple Top)SARS-COV-2 Vaccination 09/10/2019, 10/01/2019   Pneumococcal Conjugate-13 03/14/2016   Tdap 03/14/2016    TDAP status: Up to date  Flu Vaccine status: Declined, Education has been provided regarding the importance of this vaccine but patient still declined. Advised may receive this vaccine at local pharmacy or Health Dept. Aware to provide a copy of the vaccination  record if obtained from local pharmacy or Health Dept. Verbalized acceptance and understanding.  Pneumococcal vaccine status: Declined,  Education has been provided regarding the importance of this vaccine but patient still declined. Advised may receive this vaccine at local pharmacy or Health Dept. Aware to provide a copy of the vaccination record if obtained from local pharmacy or Health Dept. Verbalized acceptance and understanding.   Covid-19 vaccine status: Completed vaccines  Qualifies for Shingles Vaccine? Yes   Zostavax completed No   Shingrix Completed?: No.    Education has been provided regarding the importance of this vaccine. Patient has been advised to call insurance company to determine out of pocket expense if they have not yet received this vaccine. Advised may also receive vaccine at local pharmacy or Health Dept. Verbalized acceptance and understanding.  Screening Tests Health Maintenance  Topic Date Due   COLON CANCER SCREENING 5 YEAR SIGMOIDOSCOPY  03/05/2021   INFLUENZA VACCINE  04/08/2022 (Originally 08/08/2021)   COVID-19 Vaccine (3 - Pfizer risk series) 04/12/2022 (Originally 10/29/2019)   Zoster Vaccines- Shingrix (1 of 2) 06/27/2022 (Originally 10/16/1969)   Pneumonia Vaccine 17+ Years old (2 of 2 - PPSV23 or PCV20) 03/27/2023 (Originally 03/14/2017)   Hepatitis C Screening  03/27/2023 (Originally 10/17/1968)   Medicare Annual Wellness (AWV)  03/27/2023   COLONOSCOPY (Pts 45-69yrs Insurance coverage will need to be confirmed)  11/07/2025   DTaP/Tdap/Td (2 - Td or Tdap) 03/15/2026   HPV VACCINES  Aged Out    Health Maintenance  Health Maintenance Due  Topic Date Due   COLON CANCER SCREENING 5 YEAR SIGMOIDOSCOPY  03/05/2021    Colorectal cancer screening: Type of screening: Colonoscopy. Completed 10/32/17. Repeat every 10 years  Lung Cancer Screening: (Low Dose CT Chest recommended if Age 62-80 years, 30 pack-year currently smoking OR have quit w/in 15years.) does  not qualify.     Additional Screening:  Hepatitis C Screening: does qualify; Deferred  Vision Screening: Recommended annual ophthalmology exams for early detection of glaucoma and other disorders of the eye. Is the patient up to date with their annual eye exam?  No  Who is the provider or what is the name of the office in which the patient attends annual eye exams? Patient Deferred If pt is not established with a provider, would they like to be referred to a provider to establish care? No .   Dental Screening: Recommended annual dental exams for proper oral hygiene  Community Resource Referral / Chronic Care Management:  CRR required this visit?  No   CCM required this visit?  No      Plan:     I have personally reviewed and noted the following in the patient's chart:   Medical and social history Use of alcohol, tobacco or illicit drugs  Current medications and supplements including opioid prescriptions. Patient is currently taking opioid prescriptions. Information provided to patient regarding  non-opioid alternatives. Patient advised to discuss non-opioid treatment plan with their provider. Functional ability and status Nutritional status Physical activity Advanced directives List of other physicians Hospitalizations, surgeries, and ER visits in previous 12 months Vitals Screenings to include cognitive, depression, and falls Referrals and appointments  In addition, I have reviewed and discussed with patient certain preventive protocols, quality metrics, and best practice recommendations. A written personalized care plan for preventive services as well as general preventive health recommendations were provided to patient.     Criselda Peaches, LPN   [id number]   Nurse Notes: Patient due Hep-C and Colon Cancer Screening 5 year Sigmoidoscopy.

## 2022-03-27 NOTE — Patient Instructions (Addendum)
Todd Howell , Thank you for taking time to come for your Medicare Wellness Visit. I appreciate your ongoing commitment to your health goals. Please review the following plan we discussed and let me know if I can assist you in the future.   These are the goals we discussed:  Goals       Maintain weight and get physically stronger      No current goals (pt-stated)        This is a list of the screening recommended for you and due dates:  Health Maintenance  Topic Date Due   Colon Cancer Screening  03/05/2021   Flu Shot  04/08/2022*   COVID-19 Vaccine (3 - Pfizer risk series) 04/12/2022*   Zoster (Shingles) Vaccine (1 of 2) 06/27/2022*   Pneumonia Vaccine (2 of 2 - PPSV23 or PCV20) 03/27/2023*   Hepatitis C Screening: USPSTF Recommendation to screen - Ages 18-79 yo.  03/27/2023*   Medicare Annual Wellness Visit  03/27/2023   Colon Cancer Screening  11/07/2025   DTaP/Tdap/Td vaccine (2 - Td or Tdap) 03/15/2026   HPV Vaccine  Aged Out  *Topic was postponed. The date shown is not the original due date.  Opioid Pain Medicine Management Opioids are powerful medicines that are used to treat moderate to severe pain. When used for short periods of time, they can help you to: Sleep better. Do better in physical or occupational therapy. Feel better in the first few days after an injury. Recover from surgery. Opioids should be taken with the supervision of a trained health care provider. They should be taken for the shortest period of time possible. This is because opioids can be addictive, and the longer you take opioids, the greater your risk of addiction. This addiction can also be called opioid use disorder. What are the risks? Using opioid pain medicines for longer than 3 days increases your risk of side effects. Side effects include: Constipation. Nausea and vomiting. Breathing difficulties (respiratory depression). Drowsiness. Confusion. Opioid use disorder. Itching. Taking opioid  pain medicine for a long period of time can affect your ability to do daily tasks. It also puts you at risk for: Motor vehicle crashes. Depression. Suicide. Heart attack. Overdose, which can be life-threatening. What is a pain treatment plan? A pain treatment plan is an agreement between you and your health care provider. Pain is unique to each person, and treatments vary depending on your condition. To manage your pain, you and your health care provider need to work together. To help you do this: Discuss the goals of your treatment, including how much pain you might expect to have and how you will manage the pain. Review the risks and benefits of taking opioid medicines. Remember that a good treatment plan uses more than one approach and minimizes the chance of side effects. Be honest about the amount of medicines you take and about any drug or alcohol use. Get pain medicine prescriptions from only one health care provider. Pain can be managed with many types of alternative treatments. Ask your health care provider to refer you to one or more specialists who can help you manage pain through: Physical or occupational therapy. Counseling (cognitive behavioral therapy). Good nutrition. Biofeedback. Massage. Meditation. Non-opioid medicine. Following a gentle exercise program. How to use opioid pain medicine Taking medicine Take your pain medicine exactly as told by your health care provider. Take it only when you need it. If your pain gets less severe, you may take less than your prescribed  dose if your health care provider approves. If you are not having pain, do nottake pain medicine unless your health care provider tells you to take it. If your pain is severe, do nottry to treat it yourself by taking more pills than instructed on your prescription. Contact your health care provider for help. Write down the times when you take your pain medicine. It is easy to become confused while on  pain medicine. Writing the time can help you avoid overdose. Take other over-the-counter or prescription medicines only as told by your health care provider. Keeping yourself and others safe  While you are taking opioid pain medicine: Do not drive, use machinery, or power tools. Do not sign legal documents. Do not drink alcohol. Do not take sleeping pills. Do not supervise children by yourself. Do not do activities that require climbing or being in high places. Do not go to a lake, river, ocean, spa, or swimming pool. Do not share your pain medicine with anyone. Keep pain medicine in a locked cabinet or in a secure area where pets and children cannot reach it. Stopping your use of opioids If you have been taking opioid medicine for more than a few weeks, you may need to slowly decrease (taper) how much you take until you stop completely. Tapering your use of opioids can decrease your risk of symptoms of withdrawal, such as: Pain and cramping in the abdomen. Nausea. Sweating. Sleepiness. Restlessness. Uncontrollable shaking (tremors). Cravings for the medicine. Do not attempt to taper your use of opioids on your own. Talk with your health care provider about how to do this. Your health care provider may prescribe a step-down schedule based on how much medicine you are taking and how long you have been taking it. Getting rid of leftover pills Do not save any leftover pills. Get rid of leftover pills safely by: Taking the medicine to a prescription take-back program. This is usually offered by the county or law enforcement. Bringing them to a pharmacy that has a drug disposal container. Flushing them down the toilet. Check the label or package insert of your medicine to see whether this is safe to do. Throwing them out in the trash. Check the label or package insert of your medicine to see whether this is safe to do. If it is safe to throw it out, remove the medicine from the original  container, put it into a sealable bag or container, and mix it with used coffee grounds, food scraps, dirt, or cat litter before putting it in the trash. Follow these instructions at home: Activity Do exercises as told by your health care provider. Avoid activities that make your pain worse. Return to your normal activities as told by your health care provider. Ask your health care provider what activities are safe for you. General instructions You may need to take these actions to prevent or treat constipation: Drink enough fluid to keep your urine pale yellow. Take over-the-counter or prescription medicines. Eat foods that are high in fiber, such as beans, whole grains, and fresh fruits and vegetables. Limit foods that are high in fat and processed sugars, such as fried or sweet foods. Keep all follow-up visits. This is important. Where to find support If you have been taking opioids for a long time, you may benefit from receiving support for quitting from a local support group or counselor. Ask your health care provider for a referral to these resources in your area. Where to find more information Centers for  Disease Control and Prevention (CDC): http://www.wolf.info/ [place]. Food and Drug Administration (FDA): [place].ch Get help right away if: You may have taken too much of an opioid (overdosed). Common symptoms of an overdose: Your breathing is slower or more shallow than normal. You have a very slow heartbeat (pulse). You have slurred speech. You have nausea and vomiting. Your pupils become very small. You have other potential symptoms: You are very confused. You faint or feel like you will faint. You have cold, clammy skin. You have blue lips or fingernails. You have thoughts of harming yourself or harming others. These symptoms may represent a serious problem that is an emergency. Do not wait to see if the symptoms will go away. Get medical help right away. Call your local emergency  services (911 in the [place] Do not drive yourself to the hospital.  If you ever feel like you may hurt yourself or others, or have thoughts about taking your own life, get help right away. Go to your nearest emergency department or: Call your local emergency services (911 in the [place] Call the [facility] 878-336-8222 in the [place] Call a suicide crisis helpline, such as the [place] at (805)343-9678 or 988 in the [place]. This is open 24 hours a day in the [place] Text the Crisis Text Line at 3520334384 (in the [place].). Summary Opioid medicines can help you manage moderate to severe pain for a short period of time. A pain treatment plan is an agreement between you and your health care provider. Discuss the goals of your treatment, including how much pain you might expect to have and how you will manage the pain. If you think that you or someone else may have taken too much of an opioid, get medical help right away. This information is not intended to replace advice given to you by your health care provider. Make sure you discuss any questions you have with your health care provider. Document Revised: 07/20/2020 Document Reviewed: 04/06/2020 Elsevier Patient Education  [place] directives: Advance directive discussed with you today. Even though you declined this today, please call our office should you change your mind, and we can give you the proper paperwork for you to fill out.   Conditions/risks identified: None  Next appointment: Follow up in one year for your annual wellness visit.   Preventive Care 33 Years and Older, Male  Preventive care refers to lifestyle choices and visits with your health care provider that can promote health and wellness. What does preventive care include? A yearly physical exam. This is also called an annual well check. Dental exams once or twice a year. Routine eye exams. Ask your health care  provider how often you should have your eyes checked. Personal lifestyle choices, including: Daily care of your teeth and gums. Regular physical activity. Eating a healthy diet. Avoiding tobacco and drug use. Limiting alcohol use. Practicing safe sex. Taking low doses of aspirin every day. Taking vitamin and mineral supplements as recommended by your health care provider. What happens during an annual well check? The services and screenings done by your health care provider during your annual well check will depend on your age, overall health, lifestyle risk factors, and family history of disease. Counseling  Your health care provider may ask you questions about your: Alcohol use. Tobacco use. Drug use. Emotional well-being. Home and relationship well-being. Sexual activity. Eating habits. History of falls. Memory and ability to understand (cognition). Work and work  environment. Screening  You may have the following tests or measurements: Height, weight, and BMI. Blood pressure. Lipid and cholesterol levels. These may be checked every 5 years, or more frequently if you are over 29 years old. Skin check. Lung cancer screening. You may have this screening every year starting at age 72 if you have a 30-pack-year history of smoking and currently smoke or have quit within the past 15 years. Fecal occult blood test (FOBT) of the stool. You may have this test every year starting at age 68. Flexible sigmoidoscopy or colonoscopy. You may have a sigmoidoscopy every 5 years or a colonoscopy every 10 years starting at age 5. Prostate cancer screening. Recommendations will vary depending on your family history and other risks. Hepatitis C blood test. Hepatitis B blood test. Sexually transmitted disease (STD) testing. Diabetes screening. This is done by checking your blood sugar (glucose) after you have not eaten for a while (fasting). You may have this done every 1-3 years. Abdominal aortic  aneurysm (AAA) screening. You may need this if you are a current or former smoker. Osteoporosis. You may be screened starting at age 75 if you are at high risk. Talk with your health care provider about your test results, treatment options, and if necessary, the need for more tests. Vaccines  Your health care provider may recommend certain vaccines, such as: Influenza vaccine. This is recommended every year. Tetanus, diphtheria, and acellular pertussis (Tdap, Td) vaccine. You may need a Td booster every 10 years. Zoster vaccine. You may need this after age 62. Pneumococcal 13-valent conjugate (PCV13) vaccine. One dose is recommended after age 13. Pneumococcal polysaccharide (PPSV23) vaccine. One dose is recommended after age 23. Talk to your health care provider about which screenings and vaccines you need and how often you need them. This information is not intended to replace advice given to you by your health care provider. Make sure you discuss any questions you have with your health care provider. Document Released: 01/21/2015 Document Revised: 09/14/2015 Document Reviewed: 10/26/2014 Elsevier Interactive Patient Education  2017 [place] Prevention in the Home Falls can cause injuries. They can happen to people of all ages. There are many things you can do to make your home safe and to help prevent falls. What can I do on the outside of my home? Regularly fix the edges of walkways and driveways and fix any cracks. Remove anything that might make you trip as you walk through a door, such as a raised step or threshold. Trim any bushes or trees on the path to your home. Use bright outdoor lighting. Clear any walking paths of anything that might make someone trip, such as rocks or tools. Regularly check to see if handrails are loose or broken. Make sure that both sides of any steps have handrails. Any raised decks and porches should have guardrails on the edges. Have any leaves,  snow, or ice cleared regularly. Use sand or salt on walking paths during winter. Clean up any spills in your garage right away. This includes oil or grease spills. What can I do in the bathroom? Use night lights. Install grab bars by the toilet and in the tub and shower. Do not use towel bars as grab bars. Use non-skid mats or decals in the tub or shower. If you need to sit down in the shower, use a plastic, non-slip stool. Keep the floor dry. Clean up any water that spills on the floor as soon as it happens. Remove  soap buildup in the tub or shower regularly. Attach bath mats securely with double-sided non-slip rug tape. Do not have throw rugs and other things on the floor that can make you trip. What can I do in the bedroom? Use night lights. Make sure that you have a light by your bed that is easy to reach. Do not use any sheets or blankets that are too big for your bed. They should not hang down onto the floor. Have a firm chair that has side arms. You can use this for support while you get dressed. Do not have throw rugs and other things on the floor that can make you trip. What can I do in the kitchen? Clean up any spills right away. Avoid walking on wet floors. Keep items that you use a lot in easy-to-reach places. If you need to reach something above you, use a strong step stool that has a grab bar. Keep electrical cords out of the way. Do not use floor polish or wax that makes floors slippery. If you must use wax, use non-skid floor wax. Do not have throw rugs and other things on the floor that can make you trip. What can I do with my stairs? Do not leave any items on the stairs. Make sure that there are handrails on both sides of the stairs and use them. Fix handrails that are broken or loose. Make sure that handrails are as long as the stairways. Check any carpeting to make sure that it is firmly attached to the stairs. Fix any carpet that is loose or worn. Avoid having throw  rugs at the top or bottom of the stairs. If you do have throw rugs, attach them to the floor with carpet tape. Make sure that you have a light switch at the top of the stairs and the bottom of the stairs. If you do not have them, ask someone to add them for you. What else can I do to help prevent falls? Wear shoes that: Do not have high heels. Have rubber bottoms. Are comfortable and fit you well. Are closed at the toe. Do not wear sandals. If you use a stepladder: Make sure that it is fully opened. Do not climb a closed stepladder. Make sure that both sides of the stepladder are locked into place. Ask someone to hold it for you, if possible. Clearly mark and make sure that you can see: Any grab bars or handrails. First and last steps. Where the edge of each step is. Use tools that help you move around (mobility aids) if they are needed. These include: Canes. Walkers. Scooters. Crutches. Turn on the lights when you go into a dark area. Replace any light bulbs as soon as they burn out. Set up your furniture so you have a clear path. Avoid moving your furniture around. If any of your floors are uneven, fix them. If there are any pets around you, be aware of where they are. Review your medicines with your doctor. Some medicines can make you feel dizzy. This can increase your chance of falling. Ask your doctor what other things that you can do to help prevent falls. This information is not intended to replace advice given to you by your health care provider. Make sure you discuss any questions you have with your health care provider. Document Released: 10/21/2008 Document Revised: 06/02/2015 Document Reviewed: 01/29/2014 Elsevier Interactive Patient Education  2017 [employer].

## 2022-03-29 MED ORDER — DIVALPROEX SODIUM ER 500 MG PO TB24: ORAL_TABLET | ORAL | 0 refills | Status: DC

## 2022-04-05 ENCOUNTER — Encounter (HOSPITAL_BASED_OUTPATIENT_CLINIC_OR_DEPARTMENT_OTHER): Payer: Medicare Other | Admitting: Family Medicine

## 2022-04-24 ENCOUNTER — Other Ambulatory Visit (HOSPITAL_BASED_OUTPATIENT_CLINIC_OR_DEPARTMENT_OTHER): Payer: Self-pay | Admitting: Family Medicine

## 2022-04-24 DIAGNOSIS — G4700 Insomnia, unspecified: Secondary | ICD-10-CM

## 2022-04-24 MED ORDER — ZOLPIDEM TARTRATE 5 MG PO TABS: 5.0000 mg | ORAL_TABLET | Freq: Every evening | ORAL | 0 refills | Status: DC | PRN

## 2022-04-24 NOTE — Telephone Encounter (Signed)
Pt is calling to see if we can send in a refill of Ambien [facility] [place]   Please call if any problems.

## 2022-05-30 ENCOUNTER — Telehealth (HOSPITAL_BASED_OUTPATIENT_CLINIC_OR_DEPARTMENT_OTHER): Payer: Self-pay | Admitting: Family Medicine

## 2022-05-30 DIAGNOSIS — G4700 Insomnia, unspecified: Secondary | ICD-10-CM

## 2022-05-30 NOTE — Telephone Encounter (Signed)
Patient needs refill for Zolpidem.Marland KitchenMarland Kitchen

## 2022-05-31 MED ORDER — ZOLPIDEM TARTRATE 5 MG PO TABS: 5.0000 mg | ORAL_TABLET | Freq: Every evening | ORAL | 0 refills | Status: DC | PRN

## 2022-06-12 ENCOUNTER — Encounter (HOSPITAL_BASED_OUTPATIENT_CLINIC_OR_DEPARTMENT_OTHER): Payer: Self-pay | Admitting: Family Medicine

## 2022-06-12 ENCOUNTER — Ambulatory Visit (INDEPENDENT_AMBULATORY_CARE_PROVIDER_SITE_OTHER): Payer: Medicare Other | Admitting: Family Medicine

## 2022-06-12 VITALS — BP 126/79 | HR 74 | Temp 97.7°F | Ht 69.0 in | Wt 173.0 lb

## 2022-06-12 DIAGNOSIS — Z0000 Encounter for general adult medical examination without abnormal findings: Secondary | ICD-10-CM

## 2022-06-12 DIAGNOSIS — Z1211 Encounter for screening for malignant neoplasm of colon: Secondary | ICD-10-CM

## 2022-06-12 DIAGNOSIS — C679 Malignant neoplasm of bladder, unspecified: Secondary | ICD-10-CM

## 2022-06-12 LAB — CBC WITH DIFFERENTIAL/PLATELET
Basophils Absolute: 0.1 10*3/uL (ref 0.0–0.2)
Basos: 1 %
EOS (ABSOLUTE): 0.1 10*3/uL (ref 0.0–0.4)
Eos: 2 %
Hematocrit: 42.5 % (ref 37.5–51.0)
Hemoglobin: 14.3 g/dL (ref 13.0–17.7)
Immature Grans (Abs): 0 10*3/uL (ref 0.0–0.1)
Immature Granulocytes: 0 %
Lymphocytes Absolute: 2.5 10*3/uL (ref 0.7–3.1)
Lymphs: 34 %
MCH: 31.4 pg (ref 26.6–33.0)
MCHC: 33.6 g/dL (ref 31.5–35.7)
MCV: 93 fL (ref 79–97)
Monocytes Absolute: 0.7 10*3/uL (ref 0.1–0.9)
Monocytes: 10 %
Neutrophils Absolute: 3.8 10*3/uL (ref 1.4–7.0)
Neutrophils: 53 %
Platelets: 209 10*3/uL (ref 150–450)
RBC: 4.55 x10E6/uL (ref 4.14–5.80)
RDW: 12.5 % (ref 11.6–15.4)
WBC: 7.3 10*3/uL (ref 3.4–10.8)

## 2022-06-12 LAB — COMPREHENSIVE METABOLIC PANEL
ALT: 14 IU/L (ref 0–44)
AST: 21 IU/L (ref 0–40)
Albumin/Globulin Ratio: 1.4 (ref 1.2–2.2)
Albumin: 4.3 g/dL (ref 3.8–4.8)
Alkaline Phosphatase: 54 IU/L (ref 44–121)
BUN/Creatinine Ratio: 16 (ref 10–24)
BUN: 14 mg/dL (ref 8–27)
Bilirubin Total: 0.6 mg/dL (ref 0.0–1.2)
CO2: 23 mmol/L (ref 20–29)
Calcium: 9.9 mg/dL (ref 8.6–10.2)
Chloride: 103 mmol/L (ref 96–106)
Creatinine, Ser: 0.88 mg/dL (ref 0.76–1.27)
Globulin, Total: 3 g/dL (ref 1.5–4.5)
Glucose: 88 mg/dL (ref 70–99)
Potassium: 4.2 mmol/L (ref 3.5–5.2)
Sodium: 141 mmol/L (ref 134–144)
Total Protein: 7.3 g/dL (ref 6.0–8.5)
eGFR: 91 mL/min/{1.73_m2} (ref >59)

## 2022-06-12 LAB — LIPID PANEL
Chol/HDL Ratio: 2.2 ratio (ref 0.0–5.0)
Cholesterol, Total: 120 mg/dL (ref 100–199)
HDL: 55 mg/dL (ref >39)
LDL Chol Calc (NIH): 51 mg/dL (ref 0–99)
Triglycerides: 65 mg/dL (ref 0–149)
VLDL Cholesterol Cal: 14 mg/dL (ref 5–40)

## 2022-06-12 LAB — TSH RFX ON ABNORMAL TO FREE T4: TSH: 1.51 u[IU]/mL (ref 0.450–4.500)

## 2022-06-12 NOTE — Assessment & Plan Note (Signed)
Patient overdue for follow-up with his urologist at [facility], patient will reach out to schedule follow-up

## 2022-06-12 NOTE — Addendum Note (Signed)
Addended by: DE Peru, Marcy Salvo J on: 06/12/2022 01:10 PM   Modules accepted: Level of Service

## 2022-06-12 NOTE — Patient Instructions (Signed)
  Medication Instructions:  Your physician recommends that you continue on your current medications as directed. Please refer to the Current Medication list given to you today. --If you need a refill on any your medications before your next appointment, please call your pharmacy first. If no refills are authorized on file call the office.-- Lab Work: Your physician has recommended that you have lab work today: Yes If you have labs (blood work) drawn today and your tests are completely normal, you will receive your results via MyChart message OR a phone call from our staff.  Please ensure you check your voicemail in the event that you authorized detailed messages to be left on a delegated number. If you have any lab test that is abnormal or we need to change your treatment, we will call you to review the results.  Referrals/Procedures/Imaging: Yes  Follow-Up: Your next appointment:   Your physician recommends that you schedule a follow-up appointment in: 6 months with Dr. de Peru.  You will receive a text message or e-mail with a link to a survey about your care and experience with [place] today! We would greatly appreciate your feedback!   Thanks for letting us be apart of your health journey!!  Primary Care and Sports Medicine   Dr. Ceasar Mons Peru   We encourage you to activate your patient portal called "MyChart".  Sign up information is provided on this After Visit Summary.  MyChart is used to connect with patients for Virtual Visits (Telemedicine).  Patients are able to view lab/test results, encounter notes, upcoming appointments, etc.  Non-urgent messages can be sent to your provider as well. To learn more about what you can do with MyChart, please visit --  [email or web address].

## 2022-06-12 NOTE — Assessment & Plan Note (Signed)
Routine HCM labs ordered. HCM reviewed/discussed. Anticipatory guidance regarding healthy weight, lifestyle and choices given. Recommend healthy diet.  Recommend approximately 150 minutes/week of moderate intensity exercise Recommend regular dental and vision exams Always use seatbelt/lap and shoulder restraints Recommend using smoke alarms and checking batteries at least twice a year Recommend using sunscreen when outside Discussed colon cancer screening recommendations, options.  Patient is due, referral to [facility] placed, has had prior screening with this office Discussed immunization recommendations, patient hesitant regarding pneumococcal vaccination, will consider returning for nurse visit to complete this

## 2022-06-12 NOTE — Progress Notes (Signed)
Subjective:    CC: Annual Physical Exam  HPI:  Todd Howell is a 72 y.o. presenting for annual physical  I reviewed the past medical history, family history, social history, surgical history, and allergies today and no changes were needed.  Please see the problem list section below in epic for further details.  Past Medical History: Past Medical History:  Diagnosis Date   Asthma    mainly as a child   Bladder cancer (HCC)    Dr. Annabell Howells, [facility]   High cholesterol    Hyperlipidemia    Hypertension    sees  dr.  Sharyn Lull   Osteoarthritis of right knee 03/03/2012   Seizures [facility])    Past Surgical History: Past Surgical History:  Procedure Laterality Date   ANAL FISSURE REPAIR  09/20/2011   Lat sphincterotomy   BLADDER SURGERY  02/2005   TURBT    COLONOSCOPY     HEMORRHOID SURGERY  09/20/2011   Int hemorrhoidectomy x 2   ROTATOR CUFF REPAIR  07/2009   right   TOTAL KNEE ARTHROPLASTY Right 03/03/2012   Dr Dion Saucier   TOTAL KNEE ARTHROPLASTY Right 03/03/2012   Procedure: TOTAL KNEE ARTHROPLASTY;  Surgeon: Eulas Post, MD;  Location: [facility] OR;  Service: Orthopedics;  Laterality: Right;   Social History: Social History   Socioeconomic History   Marital status: Significant Other    Spouse name: Karin Golden   Number of children: 5   Years of education: 11   Highest education level: Not on file  Occupational History   Not on file  Tobacco Use   Smoking status: Former    Packs/day: 1.00    Years: 30.00    Additional pack years: 0.00    Total pack years: 30.00    Types: Cigarettes    Quit date: 07/02/1986    Years since quitting: 35.9   Smokeless tobacco: Never  Substance and Sexual Activity   Alcohol use: Yes    Alcohol/week: 2.0 standard drinks of alcohol    Types: 1 Glasses of wine, 1 Cans of beer per week    Comment: occasionally   Drug use: No   Sexual activity: Not on file  Other Topics Concern   Not on file  Social History Narrative   Patient is  married([name]) and lives with his wife.   Patient has 5 children.   Patient is retired.   Patient is right-handed.   Patient has a 11 grade education.   Patient drinks 2-3 cups of caffeine daily.   Social Determinants of Health   Financial Resource Strain: Low Risk  (03/27/2022)   Overall Financial Resource Strain (CARDIA)    Difficulty of Paying Living Expenses: Not hard at all  Food Insecurity: No Food Insecurity (03/27/2022)   Hunger Vital Sign    Worried About Running Out of Food in the Last Year: Never true    Ran Out of Food in the Last Year: Never true  Transportation Needs: No Transportation Needs (03/27/2022)   PRAPARE - Administrator, Civil Service (Medical): No    Lack of Transportation (Non-Medical): No  Physical Activity: Inactive (03/27/2022)   Exercise Vital Sign    Days of Exercise per Week: 0 days    Minutes of Exercise per Session: 0 min  Stress: No Stress Concern Present (03/27/2022)   [employer] of Occupational Health - Occupational Stress Questionnaire    Feeling of Stress : Not at all  Social Connections: Moderately Isolated (03/27/2022)  Social Advertising account executive [NHANES]    Frequency of Communication with Friends and Family: More than three times a week    Frequency of Social Gatherings with Friends and Family: More than three times a week    Attends Religious Services: Never    Database administrator or Organizations: No    Attends Engineer, structural: Never    Marital Status: Living with partner   Family History: Family History  Problem Relation Age of Onset   Cancer Mother        colon   Cancer Father        colon/lung   Allergies: No Known Allergies Medications: See med rec.  Review of Systems: No headache, visual changes, nausea, vomiting, diarrhea, constipation, dizziness, abdominal pain, skin rash, fevers, chills, night sweats, swollen lymph nodes, weight loss, chest pain, body aches, joint swelling,  muscle aches, shortness of breath, mood changes, visual or auditory hallucinations.  Objective:    BP 126/79 (BP Location: Right Arm, Patient Position: Sitting, Cuff Size: Normal)   Pulse 74   Temp 97.7 F (36.5 C) (Oral)   Ht 5\' 9"  (1.753 m)   Wt 173 lb (78.5 kg)   SpO2 98%   BMI 25.55 kg/m   General: Well Developed, well nourished, and in no acute distress. Neuro: Alert and oriented x3, extra-ocular muscles intact, sensation grossly intact. Cranial nerves II through XII are intact, motor, sensory, and coordinative functions are all intact. HEENT: Normocephalic, atraumatic, pupils equal round reactive to light, neck supple, no masses, no lymphadenopathy, thyroid nonpalpable. Oropharynx, nasopharynx, external ear canals are unremarkable. Skin: Warm and dry, no rashes noted. Cardiac: Regular rate and rhythm, no murmurs rubs or gallops. Respiratory: Clear to auscultation bilaterally. Not using accessory muscles, speaking in full sentences. Abdominal: Soft, nontender, nondistended, positive bowel sounds, no masses, no organomegaly. Musculoskeletal: Shoulder, elbow, wrist, hip, knee, ankle stable, and with full range of motion.  Impression and Recommendations:    Wellness examination Routine HCM labs ordered. HCM reviewed/discussed. Anticipatory guidance regarding healthy weight, lifestyle and choices given. Recommend healthy diet.  Recommend approximately 150 minutes/week of moderate intensity exercise Recommend regular dental and vision exams Always use seatbelt/lap and shoulder restraints Recommend using smoke alarms and checking batteries at least twice a year Recommend using sunscreen when outside Discussed colon cancer screening recommendations, options.  Patient is due, referral to [facility] placed, has had prior screening with this office Discussed immunization recommendations, patient hesitant regarding pneumococcal vaccination, will consider returning for nurse visit to complete  this  Return in about 6 months (around 12/12/2022) for HTN.   ___________________________________________ Vitoria Conyer de [place], MD, ABFM, CAQSM Primary Care and Sports Medicine [facility]

## 2022-07-21 ENCOUNTER — Other Ambulatory Visit (HOSPITAL_BASED_OUTPATIENT_CLINIC_OR_DEPARTMENT_OTHER): Payer: Self-pay | Admitting: Family Medicine

## 2022-07-21 DIAGNOSIS — G4700 Insomnia, unspecified: Secondary | ICD-10-CM

## 2022-07-23 NOTE — Telephone Encounter (Signed)
 Please advise on refill request

## 2022-07-31 ENCOUNTER — Telehealth (HOSPITAL_BASED_OUTPATIENT_CLINIC_OR_DEPARTMENT_OTHER): Payer: Self-pay | Admitting: Family Medicine

## 2022-07-31 NOTE — Telephone Encounter (Signed)
Patient calling in for ambien refill

## 2022-09-17 ENCOUNTER — Telehealth (HOSPITAL_BASED_OUTPATIENT_CLINIC_OR_DEPARTMENT_OTHER): Payer: Self-pay | Admitting: Family Medicine

## 2022-09-17 DIAGNOSIS — G4700 Insomnia, unspecified: Secondary | ICD-10-CM

## 2022-09-17 NOTE — Telephone Encounter (Signed)
Patient calling in asking for refill on ambien.

## 2022-09-18 MED ORDER — ZOLPIDEM TARTRATE 5 MG PO TABS
5.0000 mg | ORAL_TABLET | Freq: Every evening | ORAL | 0 refills | Status: DC | PRN
Start: 2022-09-18 — End: 2022-11-13

## 2022-09-25 ENCOUNTER — Other Ambulatory Visit: Payer: Self-pay | Admitting: Family Medicine

## 2022-09-25 MED ORDER — DIVALPROEX SODIUM ER 500 MG PO TB24: ORAL_TABLET | ORAL | 1 refills | Status: DC

## 2022-09-25 NOTE — Telephone Encounter (Signed)
Pt has scheduled his 1 yr f/u, and is on wait list.  Pt is now requesting a refill on his divalproex (DEPAKOTE ER) 500 MG 24 hr tablet thru [facility]

## 2022-09-25 NOTE — Telephone Encounter (Signed)
Last seen on 09/05/21 Follow up scheduled on 04/16/23   Amy you mentioned at the last visit about pt sending you labs. His last Valproic Acid level was in 2022 with you. Are you okay with me refilling Rx? He scheduled to see you in April 2025.  Please advise

## 2022-11-12 ENCOUNTER — Telehealth (HOSPITAL_BASED_OUTPATIENT_CLINIC_OR_DEPARTMENT_OTHER): Payer: Self-pay | Admitting: Family Medicine

## 2022-11-12 DIAGNOSIS — G4700 Insomnia, unspecified: Secondary | ICD-10-CM

## 2022-11-12 NOTE — Telephone Encounter (Signed)
Needs a script for [employer]

## 2022-11-13 MED ORDER — ZOLPIDEM TARTRATE 5 MG PO TABS
5.0000 mg | ORAL_TABLET | Freq: Every evening | ORAL | 0 refills | Status: DC | PRN
Start: 2022-11-13 — End: 2022-12-12

## 2022-11-13 NOTE — Addendum Note (Signed)
Addended by: DE Peru, Marcy Salvo J on: 11/13/2022 12:17 PM   Modules accepted: Orders

## 2022-12-12 ENCOUNTER — Encounter (HOSPITAL_BASED_OUTPATIENT_CLINIC_OR_DEPARTMENT_OTHER): Payer: Self-pay | Admitting: Family Medicine

## 2022-12-12 ENCOUNTER — Ambulatory Visit (HOSPITAL_BASED_OUTPATIENT_CLINIC_OR_DEPARTMENT_OTHER): Payer: Medicare Other | Admitting: Family Medicine

## 2022-12-12 VITALS — BP 127/78 | HR 56 | Temp 97.9°F | Ht 69.0 in | Wt 177.7 lb

## 2022-12-12 DIAGNOSIS — I10 Essential (primary) hypertension: Secondary | ICD-10-CM | POA: Diagnosis not present

## 2022-12-12 DIAGNOSIS — G4700 Insomnia, unspecified: Secondary | ICD-10-CM | POA: Diagnosis not present

## 2022-12-12 DIAGNOSIS — Z0000 Encounter for general adult medical examination without abnormal findings: Secondary | ICD-10-CM

## 2022-12-12 MED ORDER — ZOLPIDEM TARTRATE 5 MG PO TABS
5.0000 mg | ORAL_TABLET | Freq: Every evening | ORAL | 0 refills | Status: DC | PRN
Start: 2022-12-12 — End: 2023-02-11

## 2022-12-12 NOTE — Assessment & Plan Note (Signed)
Has been utilizing Ambien as needed.  PDMP reviewed today, no red flags.  Can allow for refill of prescription today, sent to pharmacy on file.  Patient aware of potential risks and side effects

## 2022-12-12 NOTE — Progress Notes (Signed)
    Procedures performed today:    None.  Independent interpretation of notes and tests performed by another provider:   None.  Brief History, Exam, Impression, and Recommendations:    BP 127/78 (BP Location: Left Arm, Patient Position: Sitting, Cuff Size: Normal)   Pulse (!) 56   Temp 97.9 F (36.6 C) (Oral)   Ht 5\' 9"  (1.753 m)   Wt 177 lb 11.2 oz (80.6 kg)   SpO2 98%   BMI 26.24 kg/m   Primary hypertension Assessment & Plan: Blood pressure controlled in office today.  Has not been checking regularly at home.  No current issues with chest pain or headaches.  Can continue with current regimen, no changes to be made today.  Recommend intermittent monitoring blood pressure at home, DASH diet   Insomnia, unspecified type Assessment & Plan: Has been utilizing Ambien as needed.  PDMP reviewed today, no red flags.  Can allow for refill of prescription today, sent to pharmacy on file.  Patient aware of potential risks and side effects  Orders: -     Zolpidem Tartrate; Take 1 tablet (5 mg total) by mouth at bedtime as needed for sleep.  Dispense: 30 tablet; Refill: 0  He did have questions about pneumococcal vaccine.  He previously has had PCV 13 several years ago.  We discussed recommendations to complete pneumococcal vaccine series, would recommend having PCV 20 completed in office.  He indicates that he will be traveling soon and is hesitant about receiving vaccine shortly before traveling.  Advised that we can complete this at future office visit or if he would like to complete this at a nurse visit, he can schedule this at any time  Return in about 6 months (around 06/12/2023) for CPE with fasting labs 1 week prior.   ___________________________________________ Juda Lajeunesse de [place], MD, ABFM, CAQSM Primary Care and Sports Medicine [facility]

## 2022-12-12 NOTE — Assessment & Plan Note (Signed)
Blood pressure controlled in office today.  Has not been checking regularly at home.  No current issues with chest pain or headaches.  Can continue with current regimen, no changes to be made today.  Recommend intermittent monitoring blood pressure at home, DASH diet

## 2023-01-10 ENCOUNTER — Encounter (HOSPITAL_BASED_OUTPATIENT_CLINIC_OR_DEPARTMENT_OTHER): Payer: Self-pay | Admitting: Emergency Medicine

## 2023-01-10 ENCOUNTER — Other Ambulatory Visit (HOSPITAL_BASED_OUTPATIENT_CLINIC_OR_DEPARTMENT_OTHER): Payer: Self-pay

## 2023-01-10 ENCOUNTER — Other Ambulatory Visit: Payer: Self-pay

## 2023-01-10 ENCOUNTER — Emergency Department (HOSPITAL_BASED_OUTPATIENT_CLINIC_OR_DEPARTMENT_OTHER)
Admission: EM | Admit: 2023-01-10 | Discharge: 2023-01-10 | Disposition: A | Discharge status: Home / Self Care | Payer: Medicare Other | Attending: Emergency Medicine | Admitting: Emergency Medicine

## 2023-01-10 DIAGNOSIS — I10 Essential (primary) hypertension: Secondary | ICD-10-CM | POA: Diagnosis not present

## 2023-01-10 DIAGNOSIS — H9201 Otalgia, right ear: Secondary | ICD-10-CM | POA: Insufficient documentation

## 2023-01-10 DIAGNOSIS — Z8551 Personal history of malignant neoplasm of bladder: Secondary | ICD-10-CM | POA: Diagnosis not present

## 2023-01-10 DIAGNOSIS — H9209 Otalgia, unspecified ear: Secondary | ICD-10-CM

## 2023-01-10 MED ORDER — CIPROFLOXACIN-DEXAMETHASONE 0.3-0.1 % OT SUSP: 4.0000 [drp] | Freq: Two times a day (BID) | OTIC | 0 refills | Status: DC

## 2023-01-10 MED ORDER — IBUPROFEN 600 MG PO TABS
600.0000 mg | ORAL_TABLET | Freq: Four times a day (QID) | ORAL | 0 refills | Status: AC | PRN
  Filled 2023-01-10: qty 30, 8d supply, fill #0

## 2023-01-10 MED ORDER — IBUPROFEN 600 MG PO TABS: 600.0000 mg | ORAL_TABLET | Freq: Four times a day (QID) | ORAL | 0 refills | Status: DC | PRN

## 2023-01-10 MED ORDER — CIPROFLOXACIN-DEXAMETHASONE 0.3-0.1 % OT SUSP
4.0000 [drp] | Freq: Two times a day (BID) | OTIC | 0 refills | Status: AC
  Filled 2023-01-10: qty 7.5, 19d supply, fill #0

## 2023-01-10 NOTE — Discharge Instructions (Addendum)
 As discussed, we will try a couple different medicines for treatment of your symptoms.  Will put you on anti-inflammatory pills to try to help with the pain as well as eardrops given some discharge appreciated in your right ear.  Your eardrum seem intact which is reassuring.  Will send referral for ENT specialist to follow-up with regarding your ear pain.  Please do not hesitate to return if the worrisome signs and symptoms we discussed become apparent.

## 2023-01-10 NOTE — ED Notes (Signed)
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
## 2023-01-15 ENCOUNTER — Other Ambulatory Visit (HOSPITAL_BASED_OUTPATIENT_CLINIC_OR_DEPARTMENT_OTHER): Payer: Self-pay

## 2023-02-08 ENCOUNTER — Telehealth (INDEPENDENT_AMBULATORY_CARE_PROVIDER_SITE_OTHER): Payer: Self-pay | Admitting: Otolaryngology

## 2023-02-08 NOTE — Telephone Encounter (Signed)
Reminder Call: Date: 02/11/2023 Status: Sch  Time: 2:50 PM [address] [place], [place] [address]  Confirmed time and location w/patient.

## 2023-02-09 ENCOUNTER — Other Ambulatory Visit (HOSPITAL_BASED_OUTPATIENT_CLINIC_OR_DEPARTMENT_OTHER): Payer: Self-pay | Admitting: Family Medicine

## 2023-02-09 DIAGNOSIS — G4700 Insomnia, unspecified: Secondary | ICD-10-CM

## 2023-02-11 ENCOUNTER — Ambulatory Visit (INDEPENDENT_AMBULATORY_CARE_PROVIDER_SITE_OTHER): Payer: Medicare Other | Admitting: Otolaryngology

## 2023-02-11 ENCOUNTER — Ambulatory Visit (INDEPENDENT_AMBULATORY_CARE_PROVIDER_SITE_OTHER): Payer: Medicare Other | Admitting: Audiology

## 2023-02-11 VITALS — BP 138/89 | HR 65 | Ht 69.0 in | Wt 175.0 lb

## 2023-02-11 DIAGNOSIS — H903 Sensorineural hearing loss, bilateral: Secondary | ICD-10-CM | POA: Diagnosis not present

## 2023-02-11 DIAGNOSIS — H9203 Otalgia, bilateral: Secondary | ICD-10-CM
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
## 2023-02-12 DIAGNOSIS — H903 Sensorineural hearing loss, bilateral: Secondary | ICD-10-CM | POA: Insufficient documentation

## 2023-02-12 DIAGNOSIS — H9203 Otalgia, bilateral: Secondary | ICD-10-CM | POA: Insufficient documentation
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
## 2023-02-13 ENCOUNTER — Telehealth (HOSPITAL_BASED_OUTPATIENT_CLINIC_OR_DEPARTMENT_OTHER): Payer: Self-pay | Admitting: *Deleted

## 2023-02-13 NOTE — Telephone Encounter (Signed)
 Copied from CRM [id number]. Topic: Clinical - Medical Advice >> Feb 13, 2023  1:06 PM Graeme ORN wrote: Reason for CRM: Patient called wanted to ask provider about B12 injections. He knows people that take them and wanted to know if the doctor thought it would be something good for him to take for energy or other things. Would like to know more about B12 and if it is offered here. Thank You      Dr. De Cuba, please see the above message sent by E2C2 and advise.

## 2023-02-14 NOTE — Telephone Encounter (Signed)
 Called and spoke with pt letting him know the info per Dr.De Peru and he verbalized understanding. Nothing further needed.
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
## 2023-03-05 NOTE — Patient Instructions (Signed)
 Below is our plan:  We will continue divalproex ER 500mg  daily   Please make sure you are consistent with timing of seizure medication. I recommend annual visit with primary care provider (PCP) for complete physical and routine blood work. I recommend daily intake of vitamin D (400-800iu) and calcium (800-1000mg ) for bone health. Discuss Dexa screening with PCP.   According to [place] law, you can not drive unless you are seizure / syncope free for at least 6 months and under physician's care.  Please maintain precautions. Do not participate in activities where a loss of awareness could harm you or someone else. No swimming alone, no tub bathing, no hot tubs, no driving, no operating motorized vehicles (cars, ATVs, motocycles, etc), lawnmowers, power tools or firearms. No standing at heights, such as rooftops, ladders or stairs. Avoid hot objects such as stoves, heaters, open fires. Wear a helmet when riding a bicycle, scooter, skateboard, etc. and avoid areas of traffic. Set your water heater to 120 degrees or less.  SUDEP is the sudden, unexpected death of someone with epilepsy, who was otherwise healthy. In SUDEP cases, no other cause of death is found when an autopsy is done. Each year, more than 1 in 1,000 people with epilepsy die from SUDEP. This is the leading cause of death in people with uncontrolled seizures. Until further answers are available, the best way to prevent SUDEP is to lower your risk by controlling seizures. Research has found that people with all types of epilepsy that experience convulsive seizures can be at risk.  Please make sure you are staying well hydrated. I recommend 50-60 ounces daily. Well balanced diet and regular exercise encouraged. Consistent sleep schedule with 6-8 hours recommended.   Please continue follow up with care team as directed.   Follow up with me in 1 year   You may receive a survey regarding today's visit. I encourage you to leave honest feed back as I  do use this information to improve patient care. Thank you for seeing me today!

## 2023-03-06 ENCOUNTER — Encounter: Payer: Self-pay | Admitting: Family Medicine

## 2023-03-06 ENCOUNTER — Ambulatory Visit: Payer: Medicare Other | Admitting: Family Medicine

## 2023-03-06 VITALS — BP 117/70 | HR 61 | Ht 69.0 in | Wt 179.0 lb

## 2023-03-06 DIAGNOSIS — G40309 Generalized idiopathic epilepsy and epileptic syndromes, not intractable, without status epilepticus: Secondary | ICD-10-CM | POA: Diagnosis not present

## 2023-03-06 MED ORDER — DIVALPROEX SODIUM ER 500 MG PO TB24: ORAL_TABLET | ORAL | 3 refills | Status: DC

## 2023-04-02 ENCOUNTER — Encounter (HOSPITAL_BASED_OUTPATIENT_CLINIC_OR_DEPARTMENT_OTHER): Payer: Medicare Other

## 2023-04-10 ENCOUNTER — Telehealth (HOSPITAL_BASED_OUTPATIENT_CLINIC_OR_DEPARTMENT_OTHER): Payer: Self-pay | Admitting: *Deleted

## 2023-04-10 DIAGNOSIS — G4700 Insomnia, unspecified: Secondary | ICD-10-CM

## 2023-04-10 NOTE — Telephone Encounter (Signed)
 Pt would like a refill on ambien this can be sent to [facility] [address]

## 2023-04-11 MED ORDER — ZOLPIDEM TARTRATE 5 MG PO TABS: 5.0000 mg | ORAL_TABLET | Freq: Every evening | ORAL | 0 refills | Status: DC | PRN

## 2023-04-11 NOTE — Addendum Note (Signed)
 Addended by: DE Peru, Marcy Salvo J on: 04/11/2023 12:24 PM   Modules accepted: Orders

## 2023-04-16 ENCOUNTER — Ambulatory Visit: Payer: Medicare Other | Admitting: Family Medicine

## 2023-04-22 ENCOUNTER — Encounter (HOSPITAL_BASED_OUTPATIENT_CLINIC_OR_DEPARTMENT_OTHER): Payer: Self-pay

## 2023-04-22 ENCOUNTER — Ambulatory Visit (HOSPITAL_BASED_OUTPATIENT_CLINIC_OR_DEPARTMENT_OTHER): Admitting: *Deleted

## 2023-04-22 DIAGNOSIS — Z1159 Encounter for screening for other viral diseases: Secondary | ICD-10-CM | POA: Diagnosis not present

## 2023-04-22 DIAGNOSIS — Z0100 Encounter for examination of eyes and vision without abnormal findings: Secondary | ICD-10-CM

## 2023-04-22 DIAGNOSIS — Z0000 Encounter for general adult medical examination without abnormal findings: Secondary | ICD-10-CM

## 2023-04-22 NOTE — Patient Instructions (Signed)
 Todd Howell , Thank you for taking time to come for your Medicare Wellness Visit. I appreciate your ongoing commitment to your health goals. Please review the following plan we discussed and let me know if I can assist you in the future.   Screening recommendations/referrals: Colonoscopy: due now patient will contact gastro Recommended yearly ophthalmology/optometry visit for glaucoma screening and checkup Recommended yearly dental visit for hygiene and checkup  Vaccinations: Influenza vaccine: information provided Pneumococcal vaccine: will discuss with provider Tdap vaccine: completed Shingles vaccine: will discuss with provider    Advanced directives: information provider  Conditions/risks identified: falls  Next appointment: 1 year  Preventive Care 44 Years and Older, Male Preventive care refers to lifestyle choices and visits with your health care provider that can promote health and wellness. What does preventive care include? A yearly physical exam. This is also called an annual well check. Dental exams once or twice a year. Routine eye exams. Ask your health care provider how often you should have your eyes checked. Personal lifestyle choices, including: Daily care of your teeth and gums. Regular physical activity. Eating a healthy diet. Avoiding tobacco and drug use. Limiting alcohol use. Practicing safe sex. Taking low doses of aspirin every day. Taking vitamin and mineral supplements as recommended by your health care provider. What happens during an annual well check? The services and screenings done by your health care provider during your annual well check will depend on your age, overall health, lifestyle risk factors, and family history of disease. Counseling  Your health care provider may ask you questions about your: Alcohol use. Tobacco use. Drug use. Emotional well-being. Home and relationship well-being. Sexual activity. Eating habits. History of  falls. Memory and ability to understand (cognition). Work and work Astronomer. Screening  You may have the following tests or measurements: Height, weight, and BMI. Blood pressure. Lipid and cholesterol levels. These may be checked every 5 years, or more frequently if you are over 69 years old. Skin check. Lung cancer screening. You may have this screening every year starting at age 40 if you have a 30-pack-year history of smoking and currently smoke or have quit within the past 15 years. Fecal occult blood test (FOBT) of the stool. You may have this test every year starting at age 74. Flexible sigmoidoscopy or colonoscopy. You may have a sigmoidoscopy every 5 years or a colonoscopy every 10 years starting at age 26. Prostate cancer screening. Recommendations will vary depending on your family history and other risks. Hepatitis C blood test. Hepatitis B blood test. Sexually transmitted disease (STD) testing. Diabetes screening. This is done by checking your blood sugar (glucose) after you have not eaten for a while (fasting). You may have this done every 1-3 years. Abdominal aortic aneurysm (AAA) screening. You may need this if you are a current or former smoker. Osteoporosis. You may be screened starting at age 88 if you are at high risk. Talk with your health care provider about your test results, treatment options, and if necessary, the need for more tests. Vaccines  Your health care provider may recommend certain vaccines, such as: Influenza vaccine. This is recommended every year. Tetanus, diphtheria, and acellular pertussis (Tdap, Td) vaccine. You may need a Td booster every 10 years. Zoster vaccine. You may need this after age 65. Pneumococcal 13-valent conjugate (PCV13) vaccine. One dose is recommended after age 51. Pneumococcal polysaccharide (PPSV23) vaccine. One dose is recommended after age 47. Talk to your health care provider about which screenings and  vaccines you need and  how often you need them. This information is not intended to replace advice given to you by your health care provider. Make sure you discuss any questions you have with your health care provider. Document Released: 01/21/2015 Document Revised: 09/14/2015 Document Reviewed: 10/26/2014 Elsevier Interactive Patient Education  2017 [employer].  Fall Prevention in the Home Falls can cause injuries. They can happen to people of all ages. There are many things you can do to make your home safe and to help prevent falls. What can I do on the outside of my home? Regularly fix the edges of walkways and driveways and fix any cracks. Remove anything that might make you trip as you walk through a door, such as a raised step or threshold. Trim any bushes or trees on the path to your home. Use bright outdoor lighting. Clear any walking paths of anything that might make someone trip, such as rocks or tools. Regularly check to see if handrails are loose or broken. Make sure that both sides of any steps have handrails. Any raised decks and porches should have guardrails on the edges. Have any leaves, snow, or ice cleared regularly. Use sand or salt on walking paths during winter. Clean up any spills in your garage right away. This includes oil or grease spills. What can I do in the bathroom? Use night lights. Install grab bars by the toilet and in the tub and shower. Do not use towel bars as grab bars. Use non-skid mats or decals in the tub or shower. If you need to sit down in the shower, use a plastic, non-slip stool. Keep the floor dry. Clean up any water that spills on the floor as soon as it happens. Remove soap buildup in the tub or shower regularly. Attach bath mats securely with double-sided non-slip rug tape. Do not have throw rugs and other things on the floor that can make you trip. What can I do in the bedroom? Use night lights. Make sure that you have a light by your bed that is easy to  reach. Do not use any sheets or blankets that are too big for your bed. They should not hang down onto the floor. Have a firm chair that has side arms. You can use this for support while you get dressed. Do not have throw rugs and other things on the floor that can make you trip. What can I do in the kitchen? Clean up any spills right away. Avoid walking on wet floors. Keep items that you use a lot in easy-to-reach places. If you need to reach something above you, use a strong step stool that has a grab bar. Keep electrical cords out of the way. Do not use floor polish or wax that makes floors slippery. If you must use wax, use non-skid floor wax. Do not have throw rugs and other things on the floor that can make you trip. What can I do with my stairs? Do not leave any items on the stairs. Make sure that there are handrails on both sides of the stairs and use them. Fix handrails that are broken or loose. Make sure that handrails are as long as the stairways. Check any carpeting to make sure that it is firmly attached to the stairs. Fix any carpet that is loose or worn. Avoid having throw rugs at the top or bottom of the stairs. If you do have throw rugs, attach them to the floor with carpet tape. Make  sure that you have a light switch at the top of the stairs and the bottom of the stairs. If you do not have them, ask someone to add them for you. What else can I do to help prevent falls? Wear shoes that: Do not have high heels. Have rubber bottoms. Are comfortable and fit you well. Are closed at the toe. Do not wear sandals. If you use a stepladder: Make sure that it is fully opened. Do not climb a closed stepladder. Make sure that both sides of the stepladder are locked into place. Ask someone to hold it for you, if possible. Clearly mark and make sure that you can see: Any grab bars or handrails. First and last steps. Where the edge of each step is. Use tools that help you move  around (mobility aids) if they are needed. These include: Canes. Walkers. Scooters. Crutches. Turn on the lights when you go into a dark area. Replace any light bulbs as soon as they burn out. Set up your furniture so you have a clear path. Avoid moving your furniture around. If any of your floors are uneven, fix them. If there are any pets around you, be aware of where they are. Review your medicines with your doctor. Some medicines can make you feel dizzy. This can increase your chance of falling. Ask your doctor what other things that you can do to help prevent falls. This information is not intended to replace advice given to you by your health care provider. Make sure you discuss any questions you have with your health care provider. Document Released: 10/21/2008 Document Revised: 06/02/2015 Document Reviewed: 01/29/2014 Elsevier Interactive Patient Education  2017 [employer].

## 2023-04-22 NOTE — Progress Notes (Signed)
 Subjective:   Todd Howell is a 73 y.o. male who presents for Medicare Annual/Subsequent preventive examination.  Visit Complete: Virtual I connected with  Ernestine Conrad on 04/22/23 by a audio enabled telemedicine application and verified that I am speaking with the correct person using two identifiers.  Patient Location: Home  Provider Location: Office/Clinic  I discussed the limitations of evaluation and management by telemedicine. The patient expressed understanding and agreed to proceed.  Vital Signs: Because this visit was a virtual/telehealth visit, some criteria may be missing or patient reported. Any vitals not documented were not able to be obtained and vitals that have been documented are patient reported.  Patient Medicare AWV questionnaire was completed by the patient on 04/22/23; I have confirmed that all information answered by patient is correct and no changes since this date.        Objective:    There were no vitals filed for this visit. There is no height or weight on file to calculate BMI.     01/10/2023   12:16 PM 03/27/2022    9:04 AM 09/19/2016    2:13 PM 04/11/2015    1:08 PM 03/04/2012    8:40 AM 02/27/2012   12:54 PM  Advanced Directives  Does Patient Have a Medical Advance Directive? No No No No Patient does not have advance directive Patient would like information  Would patient like information on creating a medical advance directive?  No - Patient declined Yes (MAU/Ambulatory/Procedural Areas - Information given) Yes - Educational materials given    Pre-existing out of facility DNR order (yellow form or pink MOST form)     No     Current Medications (verified) Outpatient Encounter Medications as of 04/22/2023  Medication Sig   CRESTOR 20 MG tablet Take 10 mg by mouth daily.   divalproex (DEPAKOTE ER) 500 MG 24 hr tablet TAKE 1 TABLET DAILY AT NIGHT   HYDROcodone-acetaminophen (NORCO) 10-325 MG tablet Take 1 tablet by mouth every 8 (eight) hours as  needed.   ibuprofen (ADVIL) 600 MG tablet Take 1 tablet (600 mg total) by mouth every 6 (six) hours as needed.   tadalafil (CIALIS) 5 MG tablet Take 1 tablet (5 mg total) by mouth daily.   telmisartan (MICARDIS) 40 MG tablet TAKE 1 TABLET BY MOUTH EVERY MORNING   zolpidem (AMBIEN) 5 MG tablet Take 1 tablet (5 mg total) by mouth at bedtime as needed. for sleep   No facility-administered encounter medications on file as of 04/22/2023.    Allergies (verified) Sesame seed (diagnostic)   History: Past Medical History:  Diagnosis Date   Asthma    mainly as a child   Bladder cancer (HCC)    Dr. Annabell Howells, [facility]   High cholesterol    Hyperlipidemia    Hypertension    sees  dr.  Sharyn Lull   Osteoarthritis of right knee 03/03/2012   Seizures [facility])    Past Surgical History:  Procedure Laterality Date   ANAL FISSURE REPAIR  09/20/2011   Lat sphincterotomy   basal cell removal     BLADDER SURGERY  02/2005   TURBT    COLONOSCOPY     HEMORRHOID SURGERY  09/20/2011   Int hemorrhoidectomy x 2   ROTATOR CUFF REPAIR  07/2009   right   TOTAL KNEE ARTHROPLASTY Right 03/03/2012   Dr Dion Saucier   TOTAL KNEE ARTHROPLASTY Right 03/03/2012   Procedure: TOTAL KNEE ARTHROPLASTY;  Surgeon: Eulas Post, MD;  Location: [facility] OR;  Service: Orthopedics;  Laterality: Right;   Family History  Problem Relation Age of Onset   Cancer Mother        colon   Cancer Father        colon/lung   Seizures Neg Hx    Social History   Socioeconomic History   Marital status: Significant Other    Spouse name: Milton Alpers   Number of children: 5   Years of education: 11   Highest education level: Not on file  Occupational History   Not on file  Tobacco Use   Smoking status: Former    Current packs/day: 0.00    Average packs/day: 1 pack/day for 30.0 years (30.0 ttl pk-yrs)    Types: Cigarettes    Start date: 07/01/1956    Quit date: 07/02/1986    Years since quitting: 36.8    Passive exposure: Past    Smokeless tobacco: Never  Vaping Use   Vaping status: Not on file  Substance and Sexual Activity   Alcohol use: Not Currently    Comment: occasionally   Drug use: No   Sexual activity: Not on file  Other Topics Concern   Not on file  Social History Narrative   Patient is married([name]) and lives with his wife.   Patient has 5 children.   Patient is retired.   Patient is right-handed.   Patient has a 11 grade education.   Patient drinks 2-3 cups of caffeine daily.   Social Drivers of Corporate investment banker Strain: Low Risk  (03/27/2022)   Overall Financial Resource Strain (CARDIA)    Difficulty of Paying Living Expenses: Not hard at all  Food Insecurity: No Food Insecurity (03/27/2022)   Hunger Vital Sign    Worried About Running Out of Food in the Last Year: Never true    Ran Out of Food in the Last Year: Never true  Transportation Needs: No Transportation Needs (03/27/2022)   PRAPARE - Administrator, Civil Service (Medical): No    Lack of Transportation (Non-Medical): No  Physical Activity: Inactive (03/27/2022)   Exercise Vital Sign    Days of Exercise per Week: 0 days    Minutes of Exercise per Session: 0 min  Stress: No Stress Concern Present (03/27/2022)   [employer] of Occupational Health - Occupational Stress Questionnaire    Feeling of Stress : Not at all  Social Connections: Moderately Isolated (03/27/2022)   Social Connection and Isolation Panel [NHANES]    Frequency of Communication with Friends and Family: More than three times a week    Frequency of Social Gatherings with Friends and Family: More than three times a week    Attends Religious Services: Never    Database administrator or Organizations: No    Attends Banker Meetings: Never    Marital Status: Living with partner    Tobacco Counseling Counseling given: Not Answered   Clinical Intake:                        Activities of Daily Living      No data to display           Patient Care Team: [place], Alonza Jansky, MD as PCP - General (Family Medicine)  Indicate any recent Medical Services you may have received from other than [facility] providers in the past year (date may be approximate).     Assessment:   This is a routine wellness examination  for [employer].  Hearing/Vision screen No results found.   Goals Addressed   None   Depression Screen    12/12/2022    9:52 AM 06/12/2022    8:46 AM 03/27/2022    9:02 AM 01/04/2022   10:14 AM 06/26/2019    3:13 PM 07/25/2018   11:37 AM 09/19/2016    2:19 PM  PHQ 2/9 Scores  PHQ - 2 Score 0 0 0 0 3 6 0  PHQ- 9 Score 1   0 7 18   Exception Documentation  Medical reason  Medical reason       Fall Risk    12/12/2022    9:52 AM 06/12/2022    8:46 AM 03/27/2022    9:04 AM 01/04/2022   10:14 AM 09/19/2016    2:19 PM  Fall Risk   Falls in the past year? 0 0 0 0 No  Number falls in past yr: 0 0 0 0   Injury with Fall? 0 0 0 0   Risk for fall due to : No Fall Risks No Fall Risks No Fall Risks No Fall Risks   Follow up Falls evaluation completed Falls evaluation completed Falls prevention discussed Falls evaluation completed     MEDICARE RISK AT HOME:    TIMED UP AND GO:  Was the test performed?  No    Cognitive Function:        03/27/2022    9:05 AM  6CIT Screen  What Year? 0 points  What month? 0 points  What time? 0 points  Count back from 20 0 points  Months in reverse 0 points  Repeat phrase 0 points  Total Score 0 points    Immunizations Immunization History  Administered Date(s) Administered   PFIZER(Purple Top)SARS-COV-2 Vaccination 09/10/2019, 10/01/2019   Pneumococcal Conjugate-13 03/14/2016   Tdap 03/14/2016    TDAP status: Up to date  Flu Vaccine status: Up to date  Pneumococcal vaccine status: Due, Education has been provided regarding the importance of this vaccine. Advised may receive this vaccine at local pharmacy or Health Dept. Aware to provide a  copy of the vaccination record if obtained from local pharmacy or Health Dept. Verbalized acceptance and understanding.  Covid-19 vaccine status: Completed vaccines  Qualifies for Shingles Vaccine? Yes   Zostavax completed No   Shingrix Completed?: No.    Education has been provided regarding the importance of this vaccine. Patient has been advised to call insurance company to determine out of pocket expense if they have not yet received this vaccine. Advised may also receive vaccine at local pharmacy or Health Dept. Verbalized acceptance and understanding.  Screening Tests Health Maintenance  Topic Date Due   Hepatitis C Screening  Never done   Zoster Vaccines- Shingrix (1 of 2) Never done   Pneumonia Vaccine 80+ Years old (2 of 2 - PPSV23) 05/09/2016   COVID-19 Vaccine (3 - Pfizer risk series) 10/29/2019   COLON CANCER SCREENING 5 YEAR SIGMOIDOSCOPY  03/05/2021   INFLUENZA VACCINE  08/09/2023   Medicare Annual Wellness (AWV)  04/21/2024   Colonoscopy  11/07/2025   DTaP/Tdap/Td (2 - Td or Tdap) 03/15/2026   HPV VACCINES  Aged Out   Meningococcal B Vaccine  Aged Out    Health Maintenance  Health Maintenance Due  Topic Date Due   Hepatitis C Screening  Never done   Zoster Vaccines- Shingrix (1 of 2) Never done   Pneumonia Vaccine 52+ Years old (2 of 2 - PPSV23) 05/09/2016   COVID-19  Vaccine (3 - Pfizer risk series) 10/29/2019   COLON CANCER SCREENING 5 YEAR SIGMOIDOSCOPY  03/05/2021    Colorectal cancer screening: Type of screening: Sigmoidoscopy. Completed 2018. Repeat every 5 years  Lung Cancer Screening: (Low Dose CT Chest recommended if Age 77-80 years, 20 pack-year currently smoking OR have quit w/in 15years.) does not qualify.   Lung Cancer Screening Referral: NA  Additional Screening:  Hepatitis C Screening: does qualify; ordered  Vision Screening: Recommended annual ophthalmology exams for early detection of glaucoma and other disorders of the eye. Is the patient  up to date with their annual eye exam?  No  Who is the provider or what is the name of the office in which the patient attends annual eye exams? NA If pt is not established with a provider, would they like to be referred to a provider to establish care? Yes .   Dental Screening: Recommended annual dental exams for proper oral hygiene   Community Resource Referral / Chronic Care Management: CRR required this visit?  No   CCM required this visit?  No     Plan:     I have personally reviewed and noted the following in the patient's chart:   Medical and social history Use of alcohol, tobacco or illicit drugs  Current medications and supplements including opioid prescriptions. Patient is currently taking opioid prescriptions. Information provided to patient regarding non-opioid alternatives. Patient advised to discuss non-opioid treatment plan with their provider. Functional ability and status Nutritional status Physical activity Advanced directives List of other physicians Hospitalizations, surgeries, and ER visits in previous 12 months Vitals Screenings to include cognitive, depression, and falls Referrals and appointments  In addition, I have reviewed and discussed with patient certain preventive protocols, quality metrics, and best practice recommendations. A written personalized care plan for preventive services as well as general preventive health recommendations were provided to patient.     Park Breed, CMA   04/22/2023   After Visit Summary: (Mail) Due to this being a telephonic visit, the after visit summary with patients personalized plan was offered to patient via mail   Nurse Notes:  Mr. Claiborne , Thank you for taking time to come for your Medicare Wellness Visit. I appreciate your ongoing commitment to your health goals. Please review the following plan we discussed and let me know if I can assist you in the future.   These are the goals we discussed:  Goals        Maintain weight and get physically stronger      No current goals (pt-stated)        This is a list of the screening recommended for you and due dates:  Health Maintenance  Topic Date Due   Hepatitis C Screening  Never done   Zoster (Shingles) Vaccine (1 of 2) Never done   Pneumonia Vaccine (2 of 2 - PPSV23) 05/09/2016   COVID-19 Vaccine (3 - Pfizer risk series) 10/29/2019   Colon Cancer Screening  03/05/2021   Flu Shot  08/09/2023   Medicare Annual Wellness Visit  04/21/2024   Colon Cancer Screening  11/07/2025   DTaP/Tdap/Td vaccine (2 - Td or Tdap) 03/15/2026   HPV Vaccine  Aged Out   Meningitis B Vaccine  Aged Out

## 2023-05-07 ENCOUNTER — Encounter (HOSPITAL_COMMUNITY): Payer: Self-pay | Admitting: Urology

## 2023-05-07 NOTE — Progress Notes (Signed)
 Spoke w/ via phone for pre-op interview--- Todd Howell Lab needs dos----   BMP and EKG per anesthesia. Surgeon  orders pending.      Lab results------ COVID test -----patient states asymptomatic no test needed Arrive at -------1115 NPO after MN NO Solid Food.  Clear liquids from MN until---1015 Pre-Surgery Ensure or G2:  Med rec completed Medications to take morning of surgery -----NONE Diabetic medication -----  GLP1 agonist last dose: GLP1 instructions:  Patient instructed no nail polish to be worn day of surgery Patient instructed to bring photo id and insurance card day of surgery Patient aware to have Driver (ride ) / caregiver    for 24 hours after surgery - Significant other Todd Howell Patient Special Instructions ----- shower with antibacterial soap. Pre-Op special Instructions -----  Patient verbalized understanding of instructions that were given at this phone interview. Patient denies chest pain, sob, fever, cough at the interview.

## 2023-05-13 NOTE — H&P (Signed)
 I have bladder cancer that has been treated.  HPI: Todd Howell is a 73 year-old male established patient who is here for follow-up of bladder cancer treatment.    04/05/23: Todd Howell returns today for overdue f/u for surveillance cystoscopy. He has had no hematuria. his UA is clear. His IPSS is 8 with nocturia x 3.   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 He was last seen in 5/20 and had a negative cystoscopy. His UA is clear today. 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 is voiding without complaints other than nocturia x3. He had a UTI about a month ago and was treated in [place] . His UA is clear today. His IPSS is 5. His PSA is 2.08 which is up from 0.45 at last check but it has been in the mid 1's before and he had the recent UTI. He has no associated signs or symptoms.      CC: AUA Questions Scoring.  HPI:     AUA Symptom Score: Less than 20% of the time he has the sensation of not emptying his bladder completely when finished urinating. Less than 20% of the time he has to urinate again fewer than two hours after he has finished urinating. Less than 20% of the time he has to start and stop again several times when he urinates. He never finds it difficult to postpone urination. Less than 50% of the time he has a weak urinary stream. He never has to push or strain to begin urination. He has to get up to urinate 3 times from the time he goes to bed until the time he gets up in the morning.   Calculated AUA Symptom Score: 8    ALLERGIES: None   MEDICATIONS: Crestor 20 MG Tablet Oral  Depakote   Micardis  40 MG Tablet Oral     GU PSH: Cystoscopy - 2022, 2020, 2018       PSH Notes: Knee Replacement, Anal Sphincterotomy, Rotator Cuff Repair, Bladder Surgery   skin cancer removal   NON-GU PSH: Incise Anal Sphincter - 2013 Revise Knee Joint - 2015 Visit Complexity (formerly GPC1X) - 01/22/2023     GU PMH: History of  bladder cancer - 01/22/2023, No recurrent tumors seen. f/u in 1 year for cystoscopy. , - 2022, Personal history of bladder cancer, - 2016 Pelvic/perineal pain - 01/22/2023 Elevated PSA, His PSA was up to 2.08 from 0.45 but that was after his recent UTI. I will repeat in 6 weeks. - 2022 Personal Hx Urinary Tract Infections, His UA is clear today. 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 - 2020 Bladder Diverticulum, Diverticulum, bladder acquired - 2016 BPH w/LUTS, Benign prostatic hyperplasia with urinary obstruction - 2016 Weak Urinary Stream, Weak urinary stream - 2016 Other microscopic hematuria, Microscopic hematuria - 2014    NON-GU PMH: Encounter for general adult medical examination without abnormal findings, Encounter for preventive health examination - 2016 Asthma, Asthma - 2014 Personal history of other diseases of the nervous system and sense organs, History of seizure disorder - 2014    FAMILY HISTORY: Colon Cancer - Mother Death In The Family Father - Father Death In The Family Mother - Mother Family Health Status Number - Runs In Family Lung Cancer - Father   SOCIAL HISTORY: Marital Status: Single Preferred Language: English; Race: White Current Smoking Status: Patient does not smoke anymore.   Tobacco  Use Assessment Completed: Used Tobacco in last 30 days? Drinks 1 caffeinated drink per day.     Notes: Retired, Former smoker, Occupation:, Tobacco Use, Marital History - Currently Married, Caffeine Use   REVIEW OF SYSTEMS:    GU Review Male:   Patient denies erection problems, get up at night to urinate, leakage of urine, hard to postpone urination, have to strain to urinate , stream starts and stops, trouble starting your stream, frequent urination, burning/ pain with urination, and penile pain.  Gastrointestinal (Upper):   Patient denies nausea, vomiting, and indigestion/ heartburn.  Gastrointestinal (Lower):    Patient denies diarrhea and constipation.  Constitutional:   Patient denies fever, night sweats, weight loss, and fatigue.  Skin:   Patient denies skin rash/ lesion and itching.  Eyes:   Patient denies blurred vision and double vision.  Ears/ Nose/ Throat:   Patient denies sore throat and sinus problems.  Hematologic/Lymphatic:   Patient denies swollen glands and easy bruising.  Cardiovascular:   Patient denies leg swelling and chest pains.  Respiratory:   Patient denies cough and shortness of breath.  Endocrine:   Patient denies excessive thirst.  Musculoskeletal:   Patient denies back pain and joint pain.  Neurological:   Patient denies headaches and dizziness.  Psychologic:   Patient denies depression and anxiety.   VITAL SIGNS: None   MULTI-SYSTEM PHYSICAL EXAMINATION:    Constitutional: Well-nourished. No physical deformities. Normally developed. Good grooming.  Respiratory: Normal breath sounds. No labored breathing, no use of accessory muscles.   Cardiovascular: Regular rate and rhythm. No murmur, no gallop.      Complexity of Data:  Records Review:   AUA Symptom Score, Previous Patient Records  Urine Test Review:   Urinalysis   09/26/20 08/08/20 10/18/16 07/27/14 05/30/13 11/15/11 09/27/08 09/12/07  PSA  Total PSA 1.24 ng/mL 2.08 ng/mL 0.45 ng/mL 1.18  0.92  0.81  1.68  1.37     PROCEDURES:         Flexible Cystoscopy - 52000  Risks, benefits, and some of the potential complications of the procedure were discussed. He was prepped with betadine and the urethral was instilled with 2% lidocaine  jelly. Cipro  500mg  given for antibiotic prophylaxis.     Meatus:  Normal size. Normal location. Normal condition.  Urethra:  No strictures.  External Sphincter:  Normal.  Verumontanum:  Normal.  Prostate:  Obstructing. Moderate hyperplasia.  Bladder Neck:  Non-obstructing.  Ureteral Orifices:  Normal location. Normal size. Normal shape. Effluxed clear urine.  Bladder:  Mild  trabeculation. A left lateral wall tumor. Erythematous mucosa in a right posterior dilation. 2 1/2 cm tumor. No stones.      The procedure was well tolerated and there were no complications.         Urinalysis w/Scope Dipstick Dipstick Cont'd Micro  Color: Yellow Bilirubin: Neg mg/dL WBC/hpf: 0 - 5/hpf  Appearance: Clear Ketones: Neg mg/dL RBC/hpf: 0 - 2/hpf  Specific Gravity: 1.015 Blood: Trace ery/uL Bacteria: Rare (0-9/hpf)  pH: 5.5 Protein: Trace mg/dL Cystals: NS (Not Seen)  Glucose: Neg mg/dL Urobilinogen: 0.2 mg/dL Casts: NS (Not Seen)    Nitrites: Neg Trichomonas: Not Present    Leukocyte Esterase: Neg leu/uL Mucous: Not Present      Epithelial Cells: NS (Not Seen)      Yeast: NS (Not Seen)      Sperm: Not Present    ASSESSMENT:      ICD-10 Details  1 GU:   Bladder Cancer Lateral - C67.2  Undiagnosed New Problem - He has a recurrence on the right lateral wall and needs cystoscopy with bilaterla RTG's, TURBT and instillation of Gemcitabine. Risks reviewed in detail.    PLAN:           Schedule Return Visit/Planned Activity: Next Available Appointment - Schedule Surgery  Procedure: Unspecified Date - Cystoscopy TURBT 2-5 cm - 01027          Document Letter(s):  Created for Patient: Clinical Summary         Notes:   CC: Dr. Chapman Commodore.

## 2023-05-14 ENCOUNTER — Other Ambulatory Visit: Payer: Self-pay

## 2023-05-14 ENCOUNTER — Ambulatory Visit (HOSPITAL_COMMUNITY)
Admission: RE | Admit: 2023-05-14 | Discharge: 2023-05-14 | Disposition: A | Discharge status: Home / Self Care | Attending: Urology | Admitting: Urology

## 2023-05-14 ENCOUNTER — Encounter (HOSPITAL_COMMUNITY)
Admission: RE | Disposition: A | Discharge status: Home / Self Care | Payer: Self-pay | Source: Home / Self Care | Attending: Urology

## 2023-05-14 ENCOUNTER — Ambulatory Visit (HOSPITAL_COMMUNITY)

## 2023-05-14 ENCOUNTER — Ambulatory Visit (HOSPITAL_BASED_OUTPATIENT_CLINIC_OR_DEPARTMENT_OTHER)

## 2023-05-14 ENCOUNTER — Encounter (HOSPITAL_COMMUNITY): Payer: Self-pay | Admitting: Urology

## 2023-05-14 DIAGNOSIS — N401 Enlarged prostate with lower urinary tract symptoms: Secondary | ICD-10-CM | POA: Insufficient documentation

## 2023-05-14 DIAGNOSIS — C672 Malignant neoplasm of lateral wall of bladder: Secondary | ICD-10-CM | POA: Diagnosis not present

## 2023-05-14 DIAGNOSIS — Z8669 Personal history of other diseases of the nervous system and sense organs: Secondary | ICD-10-CM | POA: Insufficient documentation

## 2023-05-14 DIAGNOSIS — Z87891 Personal history of nicotine dependence: Secondary | ICD-10-CM | POA: Diagnosis not present

## 2023-05-14 DIAGNOSIS — R3912 Poor urinary stream: Secondary | ICD-10-CM | POA: Diagnosis not present

## 2023-05-14 DIAGNOSIS — I10 Essential (primary) hypertension: Secondary | ICD-10-CM | POA: Diagnosis not present

## 2023-05-14 DIAGNOSIS — M1990 Unspecified osteoarthritis, unspecified site: Secondary | ICD-10-CM | POA: Insufficient documentation

## 2023-05-14 DIAGNOSIS — D494 Neoplasm of unspecified behavior of bladder: Secondary | ICD-10-CM

## 2023-05-14 DIAGNOSIS — R351 Nocturia: Secondary | ICD-10-CM | POA: Diagnosis not present

## 2023-05-14 SURGERY — TURBT, WITH CHEMOTHERAPEUTIC AGENT INSTILLATION INTO BLADDER
Anesthesia: General

## 2023-05-14 MED ORDER — MORPHINE SULFATE (PF) 2 MG/ML IV SOLN: 2.0000 mg | INTRAVENOUS | Status: DC | PRN

## 2023-05-14 MED ORDER — SUGAMMADEX SODIUM 200 MG/2ML IV SOLN
INTRAVENOUS | Status: DC | PRN
  Administered 2023-05-14: 200 mg via INTRAVENOUS

## 2023-05-14 MED ORDER — FENTANYL CITRATE (PF) 100 MCG/2ML IJ SOLN
INTRAMUSCULAR | Status: AC
  Administered 2023-05-14: 25 ug via INTRAVENOUS
  Filled 2023-05-14: qty 2

## 2023-05-14 MED ORDER — SODIUM CHLORIDE 0.9% FLUSH: 3.0000 mL | Freq: Two times a day (BID) | INTRAVENOUS | Status: DC

## 2023-05-14 MED ORDER — SODIUM CHLORIDE 0.9 % IV SOLN: 250.0000 mL | INTRAVENOUS | Status: DC | PRN

## 2023-05-14 MED ORDER — CIPROFLOXACIN IN D5W 400 MG/200ML IV SOLN
400.0000 mg | Freq: Once | INTRAVENOUS | Status: AC
  Administered 2023-05-14: 400 mg via INTRAVENOUS

## 2023-05-14 MED ORDER — ROCURONIUM BROMIDE 10 MG/ML (PF) SYRINGE
PREFILLED_SYRINGE | INTRAVENOUS | Status: DC | PRN
Start: 2023-05-14 — End: 2023-05-14
  Administered 2023-05-14: 40 mg via INTRAVENOUS
  Administered 2023-05-14: 20 mg via INTRAVENOUS

## 2023-05-14 MED ORDER — FENTANYL CITRATE (PF) 100 MCG/2ML IJ SOLN
25.0000 ug | INTRAMUSCULAR | Status: DC | PRN
  Administered 2023-05-14: 25 ug via INTRAVENOUS

## 2023-05-14 MED ORDER — MIDAZOLAM HCL 2 MG/2ML IJ SOLN
INTRAMUSCULAR | Status: AC
  Filled 2023-05-14: qty 2

## 2023-05-14 MED ORDER — DEXAMETHASONE SODIUM PHOSPHATE 10 MG/ML IJ SOLN
INTRAMUSCULAR | Status: AC
  Filled 2023-05-14: qty 1

## 2023-05-14 MED ORDER — ONDANSETRON HCL 4 MG/2ML IJ SOLN
INTRAMUSCULAR | Status: DC | PRN
  Administered 2023-05-14: 4 mg via INTRAVENOUS

## 2023-05-14 MED ORDER — FENTANYL CITRATE (PF) 250 MCG/5ML IJ SOLN
INTRAMUSCULAR | Status: AC
  Filled 2023-05-14: qty 5

## 2023-05-14 MED ORDER — LIDOCAINE 2% (20 MG/ML) 5 ML SYRINGE
INTRAMUSCULAR | Status: DC | PRN
Start: 2023-05-14 — End: 2023-05-14
  Administered 2023-05-14: 50 mg via INTRAVENOUS

## 2023-05-14 MED ORDER — ACETAMINOPHEN 650 MG RE SUPP: 650.0000 mg | RECTAL | Status: DC | PRN

## 2023-05-14 MED ORDER — ROCURONIUM BROMIDE 10 MG/ML (PF) SYRINGE
PREFILLED_SYRINGE | INTRAVENOUS | Status: AC
  Filled 2023-05-14: qty 10

## 2023-05-14 MED ORDER — SODIUM CHLORIDE 0.9% FLUSH: 3.0000 mL | INTRAVENOUS | Status: DC | PRN

## 2023-05-14 MED ORDER — ACETAMINOPHEN 325 MG PO TABS: 650.0000 mg | ORAL_TABLET | ORAL | Status: DC | PRN

## 2023-05-14 MED ORDER — ACETAMINOPHEN 10 MG/ML IV SOLN
INTRAVENOUS | Status: DC | PRN
Start: 2023-05-14 — End: 2023-05-14
  Administered 2023-05-14: 1000 mg via INTRAVENOUS

## 2023-05-14 MED ORDER — PROPOFOL 10 MG/ML IV BOLUS
INTRAVENOUS | Status: AC
  Filled 2023-05-14: qty 20

## 2023-05-14 MED ORDER — GEMCITABINE CHEMO FOR BLADDER INSTILLATION 2000 MG
2000.0000 mg | Freq: Once | INTRAVENOUS | Status: AC
  Administered 2023-05-14: 2000 mg via INTRAVESICAL
  Filled 2023-05-14 (×2): qty 52.6

## 2023-05-14 MED ORDER — LACTATED RINGERS IV SOLN: INTRAVENOUS | Status: DC

## 2023-05-14 MED ORDER — OXYCODONE HCL 5 MG PO TABS: 5.0000 mg | ORAL_TABLET | ORAL | Status: DC | PRN

## 2023-05-14 MED ORDER — DROPERIDOL 2.5 MG/ML IJ SOLN: 0.6250 mg | Freq: Once | INTRAMUSCULAR | Status: DC | PRN

## 2023-05-14 MED ORDER — ACETAMINOPHEN 10 MG/ML IV SOLN
INTRAVENOUS | Status: AC
Start: 2023-05-14 — End: ?
  Filled 2023-05-14: qty 100

## 2023-05-14 MED ORDER — LIDOCAINE 2% (20 MG/ML) 5 ML SYRINGE
INTRAMUSCULAR | Status: AC
  Filled 2023-05-14: qty 5

## 2023-05-14 MED ORDER — DEXAMETHASONE SODIUM PHOSPHATE 10 MG/ML IJ SOLN
INTRAMUSCULAR | Status: DC | PRN
  Administered 2023-05-14: 10 mg via INTRAVENOUS

## 2023-05-14 MED ORDER — ORAL CARE MOUTH RINSE: 15.0000 mL | Freq: Once | OROMUCOSAL | Status: AC

## 2023-05-14 MED ORDER — CIPROFLOXACIN IN D5W 400 MG/200ML IV SOLN
INTRAVENOUS | Status: AC
  Filled 2023-05-14: qty 200

## 2023-05-14 MED ORDER — MIDAZOLAM HCL 2 MG/2ML IJ SOLN
INTRAMUSCULAR | Status: DC | PRN
Start: 2023-05-14 — End: 2023-05-14
  Administered 2023-05-14: 2 mg via INTRAVENOUS

## 2023-05-14 MED ORDER — CHLORHEXIDINE GLUCONATE 0.12 % MT SOLN
OROMUCOSAL | Status: AC
  Filled 2023-05-14: qty 15

## 2023-05-14 MED ORDER — FENTANYL CITRATE (PF) 250 MCG/5ML IJ SOLN
INTRAMUSCULAR | Status: DC | PRN
Start: 2023-05-14 — End: 2023-05-14
  Administered 2023-05-14: 100 ug via INTRAVENOUS

## 2023-05-14 MED ORDER — IOHEXOL 300 MG/ML  SOLN
INTRAMUSCULAR | Status: DC | PRN
  Administered 2023-05-14: 10 mL via URETHRAL

## 2023-05-14 MED ORDER — PHENYLEPHRINE 80 MCG/ML (10ML) SYRINGE FOR IV PUSH (FOR BLOOD PRESSURE SUPPORT)
PREFILLED_SYRINGE | INTRAVENOUS | Status: DC | PRN
  Administered 2023-05-14: 80 ug via INTRAVENOUS
  Administered 2023-05-14: 120 ug via INTRAVENOUS

## 2023-05-14 MED ORDER — ONDANSETRON HCL 4 MG/2ML IJ SOLN
INTRAMUSCULAR | Status: AC
  Filled 2023-05-14: qty 2

## 2023-05-14 MED ORDER — PHENYLEPHRINE 80 MCG/ML (10ML) SYRINGE FOR IV PUSH (FOR BLOOD PRESSURE SUPPORT)
PREFILLED_SYRINGE | INTRAVENOUS | Status: AC
  Filled 2023-05-14: qty 10

## 2023-05-14 MED ORDER — CHLORHEXIDINE GLUCONATE 0.12 % MT SOLN
15.0000 mL | Freq: Once | OROMUCOSAL | Status: AC
  Administered 2023-05-14: 15 mL via OROMUCOSAL

## 2023-05-14 MED ORDER — PROPOFOL 10 MG/ML IV BOLUS
INTRAVENOUS | Status: DC | PRN
Start: 2023-05-14 — End: 2023-05-14
  Administered 2023-05-14: 40 mg via INTRAVENOUS
  Administered 2023-05-14: 90 mg via INTRAVENOUS

## 2023-05-14 SURGICAL SUPPLY — 19 items
BAG URINE DRAIN 2000ML AR STRL (UROLOGICAL SUPPLIES) ×1 IMPLANT
BAG URO CATCHER STRL LF (MISCELLANEOUS) ×1 IMPLANT
CATH FOLEY 2WAY SLVR 5CC 18FR (CATHETERS) IMPLANT
CATH FOLEY 3WAY 30CC 22FR (CATHETERS) IMPLANT
CATH HEMA 3WAY 30CC 22FR COUDE (CATHETERS) IMPLANT
ELECTRODE REM PT RTRN 9FT ADLT (ELECTROSURGICAL) IMPLANT
GLOVE SURG SS PI 8.0 STRL IVOR (GLOVE) ×1 IMPLANT
GOWN STRL REUS W/ TWL XL LVL3 (GOWN DISPOSABLE) ×1 IMPLANT
HOLDER FOLEY CATH W/STRAP (MISCELLANEOUS) ×1 IMPLANT
KIT TURNOVER KIT B (KITS) ×1 IMPLANT
LOOP CUT BIPOLAR 24F LRG (ELECTROSURGICAL) ×1 IMPLANT
MANIFOLD NEPTUNE II (INSTRUMENTS) ×1 IMPLANT
NS IRRIG 1000ML POUR BTL (IV SOLUTION) ×1 IMPLANT
PACK CYSTO (CUSTOM PROCEDURE TRAY) ×1 IMPLANT
SYR 30ML LL (SYRINGE) IMPLANT
SYRINGE TOOMEY IRRIG 70ML (MISCELLANEOUS) IMPLANT
TUBE CONNECTING 12X1/4 (SUCTIONS) ×1 IMPLANT
WATER STERILE IRR 1000ML POUR (IV SOLUTION) ×1 IMPLANT
WATER STERILE IRR 3000ML UROMA (IV SOLUTION) IMPLANT

## 2023-05-14 NOTE — Interval H&P Note (Signed)
 History and Physical Interval Note:  05/14/2023 12:11 PM  Todd Howell  has presented today for surgery, with the diagnosis of BLADDER TUMOR.  The various methods of treatment have been discussed with the patient and family. After consideration of risks, benefits and other options for treatment, the patient has consented to  Procedure(s) with comments: TURBT, WITH CHEMOTHERAPEUTIC AGENT INSTILLATION INTO BLADDER (N/A) - GEMCITABINE INSTILLATION BILATERAL RETROGRADE PYELOGRAM as a surgical intervention.  The patient's history has been reviewed, patient examined, no change in status, stable for surgery.  I have reviewed the patient's chart and labs.  Questions were answered to the patient's satisfaction.     Jeree Delcid

## 2023-05-14 NOTE — Anesthesia Procedure Notes (Signed)
 Procedure Name: Intubation Date/Time: 05/14/2023 1:58 PM  Performed by: Maykayla Highley C, CRNAPre-anesthesia Checklist: Patient identified, Emergency Drugs available, Suction available and Patient being monitored Patient Re-evaluated:Patient Re-evaluated prior to induction Oxygen Delivery Method: Circle system utilized Preoxygenation: Pre-oxygenation with 100% oxygen Induction Type: IV induction Ventilation: Mask ventilation without difficulty Laryngoscope Size: Mac and 3 Grade View: Grade I Tube type: Oral Tube size: 7.5 mm Number of attempts: 1 Airway Equipment and Method: Stylet and Oral airway Placement Confirmation: ETT inserted through vocal cords under direct vision, positive ETCO2 and breath sounds checked- equal and bilateral Secured at: 22 cm Tube secured with: Tape Dental Injury: Teeth and Oropharynx as per pre-operative assessment

## 2023-05-14 NOTE — Transfer of Care (Signed)
 Immediate Anesthesia Transfer of Care Note  Patient: Todd Howell  Procedure(s) Performed: TURBT, WITH CHEMOTHERAPEUTIC AGENT INSTILLATION INTO BLADDER, Bilateral Retrograde  Patient Location: PACU  Anesthesia Type:General  Level of Consciousness: awake, alert , and oriented  Airway & Oxygen Therapy: Patient Spontanous Breathing and Patient connected to face mask oxygen  Post-op Assessment: Report given to RN and Post -op Vital signs reviewed and stable  Post vital signs: Reviewed and stable  Last Vitals:  Vitals Value Taken Time  BP 128/83   Temp    Pulse 53   Resp 13   SpO2 98     Last Pain:  Vitals:   05/14/23 1204  TempSrc: Oral  PainSc: 0-No pain      Patients Stated Pain Goal: 3 (05/14/23 1204)  Complications: No notable events documented.

## 2023-05-14 NOTE — Anesthesia Preprocedure Evaluation (Signed)
 Anesthesia Evaluation  Patient identified by MRN, date of birth, ID band Patient awake    Reviewed: Allergy & Precautions, H&P , NPO status , Patient's Chart, lab work & pertinent test results, reviewed documented beta blocker date and time   History of Anesthesia Complications (+) history of anesthetic complications  Airway Mallampati: I  TM Distance: >3 FB Neck ROM: Full    Dental  (+) Teeth Intact, Caps, Dental Advisory Given   Pulmonary neg sleep apnea, neg recent URI, former smoker   Pulmonary exam normal breath sounds clear to auscultation       Cardiovascular hypertension, Pt. on medications (-) CAD and (-) Peripheral Vascular Disease Normal cardiovascular exam Rhythm:Regular Rate:Normal  '11 stress test: EF 59%, normal wall motion   Neuro/Psych Seizures - (last seizure 3 years ago), Well Controlled,     GI/Hepatic negative GI ROS, Neg liver ROS,neg GERD  ,,  Endo/Other  negative endocrine ROS    Renal/GU Renal disease     Musculoskeletal  (+) Arthritis ,    Abdominal   Peds  Hematology  (+) Blood dyscrasia, anemia   Anesthesia Other Findings   Reproductive/Obstetrics                             Anesthesia Physical Anesthesia Plan  ASA: 2  Anesthesia Plan: General   Post-op Pain Management: Ofirmev  IV (intra-op)*   Induction: Intravenous  PONV Risk Score and Plan: 3 and Ondansetron , Dexamethasone  and Treatment may vary due to age or medical condition  Airway Management Planned: Oral ETT  Additional Equipment:   Intra-op Plan:   Post-operative Plan: Extubation in OR  Informed Consent: I have reviewed the patients History and Physical, chart, labs and discussed the procedure including the risks, benefits and alternatives for the proposed anesthesia with the patient or authorized representative who has indicated his/her understanding and acceptance.     Dental advisory  given  Plan Discussed with: CRNA  Anesthesia Plan Comments:         Anesthesia Quick Evaluation

## 2023-05-14 NOTE — Op Note (Signed)
 Procedure: 1.  Cystoscopy with bilateral retrograde pyelograms and interpretation. 2.  TURBT 2 cm left lateral wall tumor. 3.  Instillation of gemcitabine in PACU. 4.  Application of fluoroscopy.  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  Postop diagnosis: Same.  Surgeon: Dr. Homero Luster.  Anesthesia: General.  Specimen: Bladder tumor chips and cup biopsy from posterior wall.  Drains: 18 French Foley catheter.  EBL: None.  Complications: None.  Indications: Patient is a 73 year old male with a history of bladder tumor she was found to have recurrence on recent surveillance cystoscopy.  Procedure: He was given antibiotic.  He was taken operating room where general anesthetic was induced.  He was placed in lithotomy position and fitted with PAS hose.  Experience genitalia were prepped with Betadine solution was draped in usual sterile fashion.  Cystoscopy was performed using a 21 [place] scope and 30 degree lens.  Examination revealed a normal urethra.  The external sphincter was intact.  The prostatic urethra was 2 to 3 cm in length with some lateral lobe hyperplasia and a slightly elevated bladder neck.  Examination of bladder revealed mild to moderate trabeculation with a small cellule on the right posterior wall and a 1.5 cm diverticulum on the left posterior wall.  There was papillary tumor fronds on the left lateral wall an area approximately 2 cm in diameter.  No other lesions were identified other than slight erythema within the larger diverticulum.  The ureteral orifices were unremarkable.  After completion of cystoscopic inspection bilateral retrograde pyelograms performed with the 5 [place] open-ended catheter and Omnipaque.  Right retrograde pyelogram demonstrated a normal ureter and intrarenal collecting system.  Left retrograde pyelogram demonstrated normal ureter and intrarenal collecting system.  A cup biopsy forceps was then used to obtain  a small biopsy from the erythematous mucosa within the diverticulum.  The cystoscope was then replaced with the 26 French continuous-flow resectoscope sheath with the aid of visual obturator.  The visual obturator was replaced with an Anette Barb handle with a bipolar loop and 30 degree lens.  The small biopsy site in the diverticulum was then fulgurated.  The tumor on the left lateral wall was then resected and the muscle and then the resection site was generously fulgurated along with the surrounding mucosa.  The bladder chips were then retrieved.  Final inspection revealed no active bleeding in the bladder but little fulguration of the bladder neck was performed.  The ureteral orifices were unremarkable.  The cystoscope was removed and an 57 French Foley catheter was inserted.  The balloon was filled with 10 mL of sterile fluid.  The catheter was irrigated with clear return and placed to straight drainage.  He was taken down from lithotomy position, his anesthetic was reversed and he was moved recovery in stable condition.  There were no complications.  In the PACU his bladder was instilled with 2000 mg of gemcitabine and 50 mL of diluent  without complication.  This was left indwelling for 1 hour and then the bladder was drained and he was discharged with the Foley.

## 2023-05-14 NOTE — Discharge Instructions (Addendum)
  Post Anesthesia Home Care Instructions  Activity: Get plenty of rest for the remainder of the day. A responsible individual must stay with you for 24 hours following the procedure.  For the next 24 hours, DO NOT: -Drive a car -Advertising copywriter -Drink alcoholic beverages -Take any medication unless instructed by your physician -Make any legal decisions or sign important papers.  Meals: Start with liquid foods such as gelatin or soup. Progress to regular foods as tolerated. Avoid greasy, spicy, heavy foods. If nausea and/or vomiting occur, drink only clear liquids until the nausea and/or vomiting subsides. Call your physician if vomiting continues.  Special Instructions/Symptoms: Your throat may feel dry or sore from the anesthesia or the breathing tube placed in your throat during surgery. If this causes discomfort, gargle with warm salt water. The discomfort should disappear within 24 hours.     You may remove the foley in the morning.

## 2023-05-15 MED FILL — Fentanyl Citrate Preservative Free (PF) Inj 100 MCG/2ML: INTRAMUSCULAR | Qty: 0.5 | Status: AC

## 2023-05-16 LAB — SURGICAL PATHOLOGY

## 2023-05-17 NOTE — Anesthesia Postprocedure Evaluation (Signed)
 Anesthesia Post Note  Patient: Todd Howell  Procedure(s) Performed: TURBT, WITH CHEMOTHERAPEUTIC AGENT INSTILLATION INTO BLADDER, Bilateral Retrograde     Patient location during evaluation: PACU Anesthesia Type: General Level of consciousness: awake and alert Pain management: pain level controlled Vital Signs Assessment: post-procedure vital signs reviewed and stable Respiratory status: spontaneous breathing, nonlabored ventilation and respiratory function stable Cardiovascular status: blood pressure returned to baseline and stable Postop Assessment: no apparent nausea or vomiting Anesthetic complications: no   No notable events documented.  Last Vitals:  Vitals:   05/14/23 1440 05/14/23 1530  BP: 130/76   Pulse: (!) 48   Resp: 18   Temp: (!) 36.4 C 36.6 C  SpO2: 96%     Last Pain:  Vitals:   05/14/23 1530  TempSrc:   PainSc: Asleep                 Bobby Barton

## 2023-06-05 ENCOUNTER — Other Ambulatory Visit (HOSPITAL_BASED_OUTPATIENT_CLINIC_OR_DEPARTMENT_OTHER): Payer: Self-pay | Admitting: Family Medicine

## 2023-06-05 DIAGNOSIS — G4700 Insomnia, unspecified: Secondary | ICD-10-CM

## 2023-06-05 NOTE — Telephone Encounter (Signed)
 Copied from CRM [id number]. Topic: Clinical - Medication Refill >> Jun 05, 2023 11:04 AM Carlatta H wrote: Medication: zolpidem  (AMBIEN ) 5 MG tablet [[id number]]  Has the patient contacted their pharmacy? No (Agent: If no, request that the patient contact the pharmacy for the refill. If patient does not wish to contact the pharmacy document the reason why and proceed with request.) (Agent: If yes, when and what did the pharmacy advise?)  This is the patient's preferred pharmacy:     [id number] [place], [place] - [address] [place] [address] Phone: [phone] Fax: [phone]    Is this the correct pharmacy for this prescription? Yes If no, delete pharmacy and type the correct one.   Has the prescription been filled recently? No  Is the patient out of the medication? Yes  Has the patient been seen for an appointment in the last year OR does the patient have an upcoming appointment? Yes  Can we respond through MyChart? Yes  Agent: Please be advised that Rx refills may take up to 3 business days. We ask that you follow-up with your pharmacy.

## 2023-06-06 MED ORDER — ZOLPIDEM TARTRATE 5 MG PO TABS: 5.0000 mg | ORAL_TABLET | Freq: Every evening | ORAL | 0 refills | Status: DC | PRN

## 2023-06-13 ENCOUNTER — Encounter (HOSPITAL_BASED_OUTPATIENT_CLINIC_OR_DEPARTMENT_OTHER): Payer: Medicare Other | Admitting: Family Medicine

## 2023-07-24 ENCOUNTER — Other Ambulatory Visit (HOSPITAL_BASED_OUTPATIENT_CLINIC_OR_DEPARTMENT_OTHER): Payer: Self-pay | Admitting: Family Medicine

## 2023-07-24 ENCOUNTER — Telehealth (HOSPITAL_BASED_OUTPATIENT_CLINIC_OR_DEPARTMENT_OTHER): Payer: Self-pay | Admitting: Family Medicine

## 2023-07-24 DIAGNOSIS — G4700 Insomnia, unspecified: Secondary | ICD-10-CM

## 2023-07-24 MED ORDER — ZOLPIDEM TARTRATE 5 MG PO TABS: 5.0000 mg | ORAL_TABLET | Freq: Every evening | ORAL | 0 refills | Status: DC | PRN

## 2023-07-24 NOTE — Telephone Encounter (Unsigned)
 Copied from CRM [id number]. Topic: Clinical - Medication Refill >> Jul 24, 2023  2:54 PM Mia F wrote: Medication: zolpidem  (AMBIEN ) 5 MG tablet  Has the patient contacted their pharmacy? Yes (Agent: If no, request that the patient contact the pharmacy for the refill. If patient does not wish to contact the pharmacy document the reason why and proceed with request.) (Agent: If yes, when and what did the pharmacy advise?)  This is the patient's preferred pharmacy:   [id number] - [place], [place] - [address] AT [facility] [address] (Pharmacy) [phone] (Work)   Is this the correct pharmacy for this prescription? Yes If no, delete pharmacy and type the correct one.   Has the prescription been filled recently? No  Is the patient out of the medication? Yes  Has the patient been seen for an appointment in the last year OR does the patient have an upcoming appointment? Yes  Can we respond through MyChart? No  Agent: Please be advised that Rx refills may take up to 3 business days. We ask that you follow-up with your pharmacy.

## 2023-07-24 NOTE — Telephone Encounter (Signed)
 Copied from CRM [id number]. Topic: Clinical - Medication Refill >> Jul 24, 2023  2:54 PM Mia F wrote: Medication: zolpidem  (AMBIEN ) 5 MG tablet  Has the patient contacted their pharmacy? Yes (Agent: If no, request that the patient contact the pharmacy for the refill. If patient does not wish to contact the pharmacy document the reason why and proceed with request.) (Agent: If yes, when and what did the pharmacy advise?)  This is the patient's preferred pharmacy:   [id number] - [place], [place] - [address] AT [facility] [address] [facility] [address] (Pharmacy) [phone] (Work)   Is this the correct pharmacy for this prescription? Yes If no, delete pharmacy and type the correct one.   Has the prescription been filled recently? No  Is the patient out of the medication? Yes  Has the patient been seen for an appointment in the last year OR does the patient have an upcoming appointment? Yes  Can we respond through MyChart? No  Agent: Please be advised that Rx refills may take up to 3 business days. We ask that you follow-up with your pharmacy.

## 2023-08-08 ENCOUNTER — Ambulatory Visit (INDEPENDENT_AMBULATORY_CARE_PROVIDER_SITE_OTHER): Payer: Medicare Other | Admitting: Otolaryngology

## 2023-08-08 ENCOUNTER — Ambulatory Visit (INDEPENDENT_AMBULATORY_CARE_PROVIDER_SITE_OTHER): Payer: Medicare Other | Admitting: Audiology

## 2023-08-29 ENCOUNTER — Telehealth (HOSPITAL_BASED_OUTPATIENT_CLINIC_OR_DEPARTMENT_OTHER): Payer: Self-pay | Admitting: Family Medicine

## 2023-08-29 DIAGNOSIS — G4700 Insomnia, unspecified: Secondary | ICD-10-CM

## 2023-08-29 MED ORDER — ZOLPIDEM TARTRATE 5 MG PO TABS
5.0000 mg | ORAL_TABLET | Freq: Every evening | ORAL | 0 refills | Status: DC | PRN
Start: 2023-08-29 — End: 2023-10-01

## 2023-08-29 NOTE — Telephone Encounter (Signed)
 Patient came by the office stating he needed a refill on his ambien  but would like [facility] [address] please. Please advise thank you.

## 2023-09-17 ENCOUNTER — Other Ambulatory Visit (HOSPITAL_BASED_OUTPATIENT_CLINIC_OR_DEPARTMENT_OTHER): Payer: Self-pay | Admitting: *Deleted

## 2023-09-17 DIAGNOSIS — Z0000 Encounter for general adult medical examination without abnormal findings: Secondary | ICD-10-CM

## 2023-09-18 LAB — CBC WITH DIFFERENTIAL/PLATELET
Basophils Absolute: 0.1 x10E3/uL (ref 0.0–0.2)
Basos: 1 %
EOS (ABSOLUTE): 0.2 x10E3/uL (ref 0.0–0.4)
Eos: 3 %
Hematocrit: 41.7 % (ref 37.5–51.0)
Hemoglobin: 13.6 g/dL (ref 13.0–17.7)
Immature Grans (Abs): 0 x10E3/uL (ref 0.0–0.1)
Immature Granulocytes: 0 %
Lymphocytes Absolute: 2.5 x10E3/uL (ref 0.7–3.1)
Lymphs: 34 %
MCH: 30.8 pg (ref 26.6–33.0)
MCHC: 32.6 g/dL (ref 31.5–35.7)
MCV: 95 fL (ref 79–97)
Monocytes Absolute: 0.9 x10E3/uL (ref 0.1–0.9)
Monocytes: 13 %
Neutrophils Absolute: 3.6 x10E3/uL (ref 1.4–7.0)
Neutrophils: 49 %
Platelets: 196 x10E3/uL (ref 150–450)
RBC: 4.41 x10E6/uL (ref 4.14–5.80)
RDW: 12.4 % (ref 11.6–15.4)
WBC: 7.2 x10E3/uL (ref 3.4–10.8)

## 2023-09-18 LAB — COMPREHENSIVE METABOLIC PANEL WITH GFR
ALT: 15 IU/L (ref 0–44)
AST: 19 IU/L (ref 0–40)
Albumin: 4.3 g/dL (ref 3.8–4.8)
Alkaline Phosphatase: 54 IU/L (ref 44–121)
BUN/Creatinine Ratio: 21 (ref 10–24)
BUN: 17 mg/dL (ref 8–27)
Bilirubin Total: 0.6 mg/dL (ref 0.0–1.2)
CO2: 24 mmol/L (ref 20–29)
Calcium: 9.4 mg/dL (ref 8.6–10.2)
Chloride: 103 mmol/L (ref 96–106)
Creatinine, Ser: 0.82 mg/dL (ref 0.76–1.27)
Globulin, Total: 2.7 g/dL (ref 1.5–4.5)
Glucose: 82 mg/dL (ref 70–99)
Potassium: 4 mmol/L (ref 3.5–5.2)
Sodium: 139 mmol/L (ref 134–144)
Total Protein: 7 g/dL (ref 6.0–8.5)
eGFR: 93 mL/min/1.73 (ref >59)

## 2023-09-18 LAB — LIPID PANEL
Chol/HDL Ratio: 2.3 ratio (ref 0.0–5.0)
Cholesterol, Total: 119 mg/dL (ref 100–199)
HDL: 52 mg/dL (ref >39)
LDL Chol Calc (NIH): 53 mg/dL (ref 0–99)
Triglycerides: 66 mg/dL (ref 0–149)
VLDL Cholesterol Cal: 14 mg/dL (ref 5–40)

## 2023-09-18 LAB — TSH RFX ON ABNORMAL TO FREE T4: TSH: 4.08 u[IU]/mL (ref 0.450–4.500)

## 2023-09-24 ENCOUNTER — Encounter (HOSPITAL_BASED_OUTPATIENT_CLINIC_OR_DEPARTMENT_OTHER): Payer: Self-pay | Admitting: Family Medicine

## 2023-09-24 ENCOUNTER — Ambulatory Visit (INDEPENDENT_AMBULATORY_CARE_PROVIDER_SITE_OTHER): Admitting: Family Medicine

## 2023-09-24 VITALS — BP 124/77 | HR 82 | Ht 69.0 in | Wt 172.2 lb

## 2023-09-24 DIAGNOSIS — Z0000 Encounter for general adult medical examination without abnormal findings: Secondary | ICD-10-CM | POA: Diagnosis not present

## 2023-09-24 DIAGNOSIS — C44519 Basal cell carcinoma of skin of other part of trunk: Secondary | ICD-10-CM | POA: Insufficient documentation

## 2023-09-24 NOTE — Progress Notes (Signed)
 Subjective:    CC: Annual Physical Exam  HPI: Todd Howell is a 73 y.o. presenting for annual physical  I reviewed the past medical history, family history, social history, surgical history, and allergies today and no changes were needed.  Please see the problem list section below in epic for further details.  Past Medical History: Past Medical History:  Diagnosis Date   Asthma    mainly as a child   Bladder cancer (HCC)    Dr. Watt, [facility]   High cholesterol    Hyperlipidemia    Hypertension    sees  dr.  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   Dr Josefina   TOTAL KNEE ARTHROPLASTY Right 03/03/2012   Procedure: TOTAL KNEE ARTHROPLASTY;  Surgeon: Fonda SHAUNNA Josefina, MD;  Location: [facility] OR;  Service: Orthopedics;  Laterality: Right;   Social History: Social History   Socioeconomic History   Marital status: Significant Other    Spouse name: Norvel   Number of children: 5   Years of education: 11   Highest education level: 11th grade  Occupational History   Not on file  Tobacco Use   Smoking status: Former    Current packs/day: 0.00    Average packs/day: 1 pack/day for 30.0 years (30.0 ttl pk-yrs)    Types: Cigarettes    Start date: 07/01/1956    Quit date: 07/02/1986    Years since quitting: 37.2    Passive exposure: Past   Smokeless tobacco: Never   Tobacco comments:    Quit 37 years ago  Vaping Use   Vaping status: Not on file  Substance and Sexual Activity   Alcohol use: Not Currently    Comment: almost never   Drug use: No   Sexual activity: Not  Currently    Birth control/protection: None  Other Topics Concern   Not on file  Social History Narrative   Patient is married(Todd Howell) and lives with his wife.   Patient has 5 children.   Patient is retired.   Patient is right-handed.   Patient has a 11 grade education.   Patient drinks 2-3 cups of caffeine daily.   Social Drivers of Corporate investment banker Strain: Low Risk  (09/18/2023)   Overall Financial Resource Strain (CARDIA)    Difficulty of Paying Living Expenses: Not hard at all  Food Insecurity: No Food Insecurity (09/18/2023)   Hunger Vital Sign    Worried About Running Out of Food in the Last Year: Never true    Ran Out of Food in the Last Year: Never true  Transportation Needs: No Transportation Needs (09/18/2023)   PRAPARE - Administrator, Civil Service (Medical): No    Lack of Transportation (Non-Medical): No  Physical Activity: Insufficiently Active (09/18/2023)   Exercise Vital Sign    Days of Exercise per Week: 3 days    Minutes of Exercise per Session: 40 min  Stress: No Stress Concern Present (09/18/2023)   [employer] of  Occupational Health - Occupational Stress Questionnaire    Feeling of Stress: Only a little  Social Connections: Moderately Integrated (09/18/2023)   Social Connection and Isolation Panel    Frequency of Communication with Friends and Family: More than three times a week    Frequency of Social Gatherings with Friends and Family: Never    Attends Religious Services: 1 to 4 times per year    Active Member of [employer] or Organizations: No    Attends Engineer, structural: Not on file    Marital Status: Living with partner   Family History: Family History  Problem Relation Age of Onset   Cancer Mother        colon   Cancer Father        colon/lung   Seizures Neg Hx    Allergies: Allergies  Allergen Reactions   Sesame Seed (Diagnostic)    Medications: See med rec.  Review of Systems: No headache, visual  changes, nausea, vomiting, diarrhea, constipation, dizziness, abdominal pain, skin rash, fevers, chills, night sweats, swollen lymph nodes, weight loss, chest pain, body aches, joint swelling, muscle aches, shortness of breath, mood changes, visual or auditory hallucinations.  Objective:    BP 124/77 (BP Location: Right Arm, Patient Position: Sitting, Cuff Size: Normal)   Pulse 82   Ht 5' 9 (1.753 m)   Wt 172 lb 3.2 oz (78.1 kg)   SpO2 94%   BMI 25.43 kg/m   General: Well Developed, well nourished, and in no acute distress.  Neuro: Alert and oriented x3, extra-ocular muscles intact, sensation grossly intact. Cranial nerves II through XII are intact, motor, sensory, and coordinative functions are all intact. HEENT: Normocephalic, atraumatic, pupils equal round reactive to light, neck supple, no masses, no lymphadenopathy, thyroid nonpalpable. Oropharynx, nasopharynx, external ear canals are unremarkable. Skin: Warm and dry, no rashes noted.  Cardiac: Regular rate and rhythm, no murmurs rubs or gallops.  Respiratory: Clear to auscultation bilaterally. Not using accessory muscles, speaking in full sentences.  Abdominal: Soft, nontender, nondistended, positive bowel sounds, no masses, no organomegaly.  Musculoskeletal: Shoulder, elbow, wrist, hip, knee, ankle stable, and with full range of motion.  Impression and Recommendations:    Wellness examination Assessment & Plan: Routine HCM labs reviewed. HCM reviewed/discussed. Anticipatory guidance regarding healthy weight, lifestyle and choices given. Recommend healthy diet.  Recommend approximately 150 minutes/week of moderate intensity exercise Recommend regular dental and vision exams Always use seatbelt/lap and shoulder restraints Recommend using smoke alarms and checking batteries at least twice a year Recommend using sunscreen when outside Discussed immunization recommendations, patient is open to receiving PCV 21 to complete  series.   Return in about 6 months (around 03/23/2024).   ___________________________________________ Tedd Cottrill de [place], MD, ABFM, CAQSM Primary Care and Sports Medicine [facility]

## 2023-09-24 NOTE — Assessment & Plan Note (Signed)
 Routine HCM labs reviewed. HCM reviewed/discussed. Anticipatory guidance regarding healthy weight, lifestyle and choices given. Recommend healthy diet.  Recommend approximately 150 minutes/week of moderate intensity exercise Recommend regular dental and vision exams Always use seatbelt/lap and shoulder restraints Recommend using smoke alarms and checking batteries at least twice a year Recommend using sunscreen when outside Discussed immunization recommendations, patient is open to receiving PCV 21 to complete series.

## 2023-09-24 NOTE — Patient Instructions (Signed)

## 2023-10-01 ENCOUNTER — Other Ambulatory Visit (HOSPITAL_BASED_OUTPATIENT_CLINIC_OR_DEPARTMENT_OTHER): Payer: Self-pay | Admitting: Family Medicine

## 2023-10-01 DIAGNOSIS — G4700 Insomnia, unspecified: Secondary | ICD-10-CM

## 2023-10-04 ENCOUNTER — Ambulatory Visit (INDEPENDENT_AMBULATORY_CARE_PROVIDER_SITE_OTHER): Admitting: Audiology

## 2023-10-04 ENCOUNTER — Ambulatory Visit (INDEPENDENT_AMBULATORY_CARE_PROVIDER_SITE_OTHER): Admitting: Otolaryngology

## 2023-10-09 ENCOUNTER — Other Ambulatory Visit (HOSPITAL_BASED_OUTPATIENT_CLINIC_OR_DEPARTMENT_OTHER): Payer: Self-pay | Admitting: Family Medicine

## 2023-10-09 DIAGNOSIS — N529 Male erectile dysfunction, unspecified: Secondary | ICD-10-CM

## 2023-10-09 MED ORDER — TADALAFIL 5 MG PO TABS: 5.0000 mg | ORAL_TABLET | Freq: Every day | ORAL | 5 refills | Status: AC

## 2023-10-09 NOTE — Telephone Encounter (Signed)
 Please advise if you are okay taking over refills of med.

## 2023-10-09 NOTE — Telephone Encounter (Signed)
 Copied from CRM #[id number]. Topic: Clinical - Medication Refill >> Oct 09, 2023 11:56 AM Jasmin G wrote: Medication: tadalafil  (CIALIS ) 5 MG tablet  Has the patient contacted their pharmacy? No (Agent: If no, request that the patient contact the pharmacy for the refill. If patient does not wish to contact the pharmacy document the reason why and proceed with request.) (Agent: If yes, when and what did the pharmacy advise?)  This is the patient's preferred pharmacy:  [id number] - [place], [place] - [address] [place] [address] Phone: [phone] Fax: [phone] Hours: Not open 24 hours  Is this the correct pharmacy for this prescription? Yes If no, delete pharmacy and type the correct one.   Has the prescription been filled recently? No  Is the patient out of the medication? Yes  Has the patient been seen for an appointment in the last year OR does the patient have an upcoming appointment? Yes  Can we respond through MyChart? No  Agent: Please be advised that Rx refills may take up to 3 business days. We ask that you follow-up with your pharmacy.

## 2023-11-08 ENCOUNTER — Other Ambulatory Visit (HOSPITAL_BASED_OUTPATIENT_CLINIC_OR_DEPARTMENT_OTHER): Payer: Self-pay | Admitting: Family Medicine

## 2023-11-08 DIAGNOSIS — G4700 Insomnia, unspecified: Secondary | ICD-10-CM

## 2023-12-25 ENCOUNTER — Telehealth (HOSPITAL_BASED_OUTPATIENT_CLINIC_OR_DEPARTMENT_OTHER): Payer: Self-pay | Admitting: Family Medicine

## 2023-12-25 DIAGNOSIS — G4700 Insomnia, unspecified: Secondary | ICD-10-CM

## 2023-12-25 MED ORDER — ZOLPIDEM TARTRATE 5 MG PO TABS: 5.0000 mg | ORAL_TABLET | Freq: Every evening | ORAL | 0 refills | Status: DC | PRN

## 2023-12-25 NOTE — Telephone Encounter (Signed)
 Patient came by front desk requesting a prescription refill for zolpidem  to [facility] [address] please advise thank you

## 2024-01-20 ENCOUNTER — Telehealth: Payer: Self-pay | Admitting: Family Medicine

## 2024-01-20 ENCOUNTER — Other Ambulatory Visit: Payer: Self-pay

## 2024-01-20 MED ORDER — DIVALPROEX SODIUM ER 500 MG PO TB24: ORAL_TABLET | ORAL | 3 refills | Status: AC

## 2024-01-20 NOTE — Telephone Encounter (Signed)
 Pt called to request medication refill divalproex  (DEPAKOTE  ER) 500 MG 24 hr tablet   Pt medication it to be sent to [id number] [address] [place], [place] - [address] Phone: [phone]  Fax: [phone]

## 2024-01-20 NOTE — Telephone Encounter (Signed)
 Script sent to pharmacy.

## 2024-01-24 ENCOUNTER — Other Ambulatory Visit (HOSPITAL_BASED_OUTPATIENT_CLINIC_OR_DEPARTMENT_OTHER): Payer: Self-pay | Admitting: Family Medicine

## 2024-01-24 ENCOUNTER — Telehealth (HOSPITAL_BASED_OUTPATIENT_CLINIC_OR_DEPARTMENT_OTHER): Payer: Self-pay | Admitting: *Deleted

## 2024-01-24 DIAGNOSIS — G4700 Insomnia, unspecified: Secondary | ICD-10-CM

## 2024-01-24 NOTE — Telephone Encounter (Signed)
 Separate refill request also received. Closing CRM

## 2024-01-24 NOTE — Telephone Encounter (Signed)
 Copied from CRM [id number]. Topic: Clinical - Medication Refill >> Jan 24, 2024 11:09 AM Kevelyn M wrote: Medication: zolpidem  (AMBIEN ) 5 MG tablet  Has the patient contacted their pharmacy? Yes (Agent: If no, request that the patient contact the pharmacy for the refill. If patient does not wish to contact the pharmacy document the reason why and proceed with request.) (Agent: If yes, when and what did the pharmacy advise?)  This is the patient's preferred pharmacy:  [id number] [place], [place] - [address] [place] [address] Phone: [phone] Fax: [phone]  Is this the correct pharmacy for this prescription? Yes If no, delete pharmacy and type the correct one.   Has the prescription been filled recently? No  Is the patient out of the medication? No  Has the patient been seen for an appointment in the last year OR does the patient have an upcoming appointment? Yes  Can we respond through MyChart? Yes  Agent: Please be advised that Rx refills may take up to 3 business days. We ask that you follow-up with your pharmacy.

## 2024-03-05 ENCOUNTER — Ambulatory Visit: Payer: Medicare Other | Admitting: Family Medicine

## 2024-03-23 ENCOUNTER — Ambulatory Visit (HOSPITAL_BASED_OUTPATIENT_CLINIC_OR_DEPARTMENT_OTHER): Admitting: Family Medicine

## 2024-06-08 ENCOUNTER — Ambulatory Visit: Admitting: Family Medicine
